# Patient Record
Sex: Male | Born: 1950 | Race: Black or African American | Hispanic: No | Marital: Single
Health system: Midwestern US, Community
[De-identification: ages and names within clinical notes are randomized; demographics above are authoritative.]

---

## 2018-07-18 ENCOUNTER — Inpatient Hospital Stay
Admit: 2018-07-18 | Discharge: 2018-07-19 | Disposition: A | Discharge status: Home / Self Care | Payer: Medicaid - Out of State | Attending: Emergency Medicine

## 2018-07-18 ENCOUNTER — Emergency Department: Admit: 2018-07-19 | Payer: Medicaid - Out of State

## 2018-07-18 DIAGNOSIS — L03115 Cellulitis of right lower limb: Secondary | ICD-10-CM

## 2018-07-18 NOTE — ED Notes (Signed)
Pt presents to ED ambulatory complaining of right leg pain x today. Pt denies injury but states, "I've been walking a lot." Pt is alert and oriented x 4, RR even and unlabored, skin is warm and dry. Assessment completed and pt updated on plan of care.      Emergency Department Nursing Plan of Care       The Nursing Plan of Care is developed from the Nursing assessment and Emergency Department Attending provider initial evaluation.  The plan of care may be reviewed in the ???ED Provider note???.    The Plan of Care was developed with the following considerations:   Patient / Family readiness to learn indicated by:verbalized understanding  Persons(s) to be included in education: patient  Barriers to Learning/Limitations:No    Signed     Alaina Williams V    07/18/2018   7:25 PM

## 2018-07-18 NOTE — ED Notes (Signed)
Duplex technician at pt bedside for procedure. Pt resting contently in room, in no acute distress, and updated on plan of care.

## 2018-07-18 NOTE — ED Notes (Signed)
Pt given sweat shirt, sweat pants, and gloves. Pt resting contently in room, in no acute distress, and updated on plan of care.

## 2018-07-18 NOTE — ED Notes (Signed)
Pt given blanket, bag meal, and waters. Pt resting contently in room watching TV, in no acute distress, and updated on plan of care.   Call light within reach.

## 2018-07-18 NOTE — ED Provider Notes (Signed)
EMERGENCY DEPARTMENT HISTORY AND PHYSICAL EXAM    Date: 07/18/2018  Patient Name: Todd Gomez    History of Presenting Illness     Chief Complaint   Patient presents with   ??? Leg Pain         History Provided By: Patient    Chief Complaint: leg pain  Duration: onset today   Timing:  Acute  Location: right leg pain  Quality: Aching and Burning  Severity: 10 out of 10  Modifying Factors: walking worsens pain  Associated Symptoms: redness of skin swelling      HPI: Kaj Vasil is a 67 y.o. male with a PMH of No significant past medical history who presents with  right right leg pain which she states started today.  Patient states he is homeless and walks a lot.  Patient states his leg is red.  Patient denies injury patient states his leg is swollen.  Patient is poor historian.  He states just give him something for the pain.    PCP: None    Current Outpatient Medications   Medication Sig Dispense Refill   ??? cephALEXin (KEFLEX) 500 mg capsule Take 1 Cap by mouth four (4) times daily for 7 days. 28 Cap 0       Past History     Past Medical History:  History reviewed. No pertinent past medical history.    Past Surgical History:  History reviewed. No pertinent surgical history.    Family History:  History reviewed. No pertinent family history.    Social History:  Social History     Tobacco Use   ??? Smoking status: Current Every Day Smoker   ??? Smokeless tobacco: Never Used   Substance Use Topics   ??? Alcohol use: Yes     Comment: occ   ??? Drug use: Never       Allergies:  No Known Allergies      Review of Systems   Review of Systems   Constitutional: Negative for fever.   HENT: Negative for congestion and sore throat.    Eyes: Negative for redness.   Respiratory: Negative for cough, chest tightness and wheezing.    Cardiovascular: Negative for chest pain.   Gastrointestinal: Negative for abdominal pain.   Musculoskeletal: Negative for arthralgias (leg pain), back pain and neck pain.   Skin: Negative for rash.    Neurological: Negative for headaches.   All other systems reviewed and are negative.      Physical Exam     Vitals:    07/18/18 1920   BP: 149/78   Pulse: 79   Resp: 18   Temp: 97.7 ??F (36.5 ??C)   SpO2: 92%   Weight: 86.2 kg (190 lb)   Height: 6' (1.829 m)     Physical Exam   Constitutional: He is oriented to person, place, and time. He appears well-developed and well-nourished.   Poor personal hygiene   HENT:   Head: Normocephalic and atraumatic.   Right Ear: External ear normal.   Mouth/Throat: Oropharynx is clear and moist.   Eyes: Conjunctivae are normal. Right eye exhibits no discharge. Left eye exhibits no discharge.   Neck: Normal range of motion. Neck supple.   Cardiovascular: Normal rate, regular rhythm and normal heart sounds.   Pulmonary/Chest: Effort normal and breath sounds normal. No respiratory distress. He has no wheezes.   Abdominal: Soft. Bowel sounds are normal. There is no tenderness.   Musculoskeletal: Normal range of motion. He exhibits no edema.  Right lower extremity red skin warm to touch moderate swelling from knee to dorsal aspect of foot no open wounds skin is taut no drainage left lower extremity mild rubor and swelling distal neurovascular status intact patient able to flex ankles and toes   Lymphadenopathy:     He has no cervical adenopathy.   Neurological: He is alert and oriented to person, place, and time. No cranial nerve deficit.   Skin: Skin is warm and dry.   Psychiatric: He has a normal mood and affect. His behavior is normal. Judgment and thought content normal.   Nursing note and vitals reviewed.        Diagnostic Study Results     Labs -   No results found for this or any previous visit (from the past 12 hour(s)).    Radiologic Studies -   No orders to display     CT Results  (Last 48 hours)    None        CXR Results  (Last 48 hours)    None            Medical Decision Making   I am the first provider for this patient.     I reviewed the vital signs, available nursing notes, past medical history, past surgical history, family history and social history.    Vital Signs-Reviewed the patient's vital signs.    Records Reviewed: Nursing Notes and Old Medical Records            Disposition:  home    DISCHARGE NOTE:         Care plan outlined and precautions discussed.  Patient has no new complaints, changes, or physical findings.  Results of tests were reviewed with the patient. All medications were reviewed with the patient; will d/c home with keflex. All of pt's questions and concerns were addressed. Patient was instructed and agrees to follow up with PCP, as well as to return to the ED upon further deterioration. Patient is ready to go home.    Follow-up Information     Follow up With Specialties Details Why Contact Info    [facility]  In 3 days  [address]  [place] [address]          Discharge Medication List as of 07/18/2018  9:41 PM      START taking these medications    Details   cephALEXin (KEFLEX) 500 mg capsule Take 1 Cap by mouth four (4) times daily for 7 days., Print, Disp-28 Cap, R-0             Provider Notes (Medical Decision Making):   DDX T cellulitis venous stasis dermatitis   procedures:  Procedures    Please note that this dictation was completed with Dragon, computer voice recognition software.  Quite often unanticipated grammatical, syntax, homophones, and other interpretive errors are inadvertently transcribed by the computer software.  Please disregard these errors.  Additionally, please excuse any errors that have escaped final proofreading.    Diagnosis     Clinical Impression:   1. Cellulitis of right lower extremity

## 2018-07-18 NOTE — ED Notes (Signed)
Discharge instructions were given to the patient by Alaina Williams V, RN.     The patient left the Emergency Department ambulatory, alert and oriented and in no acute distress with 1 prescription. The patient was encouraged to call or return to the ED for worsening issues or problems and was encouraged to schedule a follow up appointment for continuing care.     The patient verbalized understanding of discharge instructions and prescriptions, all questions were answered. The patient has no further concerns at this time.

## 2018-07-18 NOTE — ED Notes (Signed)
Pt covered in stool, sent to bathroom with bath wipes and gown to clean himself up and change.

## 2018-07-18 NOTE — ED Triage Notes (Signed)
Vascular Tech(Crystal) called in to perform a Venous Duplex of right lower extremity. ETA < 1hr.

## 2018-07-18 NOTE — Progress Notes (Signed)
Right lower extremity venous duplex negative for deep venous thrombosis Hyperechoic thrombus visualized right SSV, no color/doppler detected, appears chronic. Left common femoral vein is thrombus free.    Difficult exam due to patient uncorporating with instructions to valsalva and rotate lower extremity. Enlarged nodule visualized right groin measuring 1.69cm x 3.99cm with a velocity of 17 cm/s. Preliminary verbally given to Angela Nardella, NP.

## 2018-07-18 NOTE — ED Provider Notes (Signed)
ED Provider Notes by Ellsworth Lennox, NP at 07/18/18 2230                Author: Ellsworth Lennox, NP  Service: --  Author Type: Nurse Practitioner       Filed: 07/20/18 0140  Date of Service: 07/18/18 2230  Status: Attested           Editor: Ellsworth Lennox, NP (Nurse Practitioner)  Cosigner: [place]onse[place], Monia Pouch, MD at 07/20/18 1610          Attestation signed by Gwenyth Bender, MD at 07/20/18 [phone]          7:12 AM   I was personally available for consultation in the emergency department.  I have reviewed the chart and agree with the documentation recorded by the APP, including the assessment, treatment plan, and disposition.   Gwenyth Bender, MD                                 EMERGENCY DEPARTMENT HISTORY AND PHYSICAL EXAM      Date: 07/18/2018   Patient Name: Todd Gomez        History of Presenting Illness          Chief Complaint       Patient presents with        ?  Leg Pain              History Provided By: Patient      Chief Complaint: leg pain   Duration: onset today    Timing:  Acute   Location: right leg pain   Quality: Aching and Burning   Severity: 10 out of 10   Modifying Factors: walking worsens pain   Associated Symptoms: redness of skin  swelling         HPI: Alexsis Branscom is a  67 y.o. male with a PMH of No significant  past medical history who presents with  right right leg pain which she states started today.  Patient states he is homeless and walks a lot.  Patient states his leg is red.  Patient denies injury patient  states his leg is swollen.  Patient is poor historian.  He states just give him something for the pain.      PCP: None        Current Outpatient Medications          Medication  Sig  Dispense  Refill           ?  cephALEXin (KEFLEX) 500 mg capsule  Take 1 Cap by mouth four (4) times daily for 7 days.  28 Cap  0             Past History        Past Medical History:   History reviewed. No pertinent past medical history.      Past Surgical History:   History  reviewed. No pertinent surgical history.      Family History:   History reviewed. No pertinent family history.      Social History:     Social History          Tobacco Use         ?  Smoking status:  Current Every Day Smoker     ?  Smokeless tobacco:  Never Used       Substance Use Topics         ?  Alcohol use:  Yes             Comment: occ         ?  Drug use:  Never           Allergies:   No Known Allergies           Review of Systems     Review of Systems    Constitutional: Negative for fever.    HENT: Negative for congestion and sore throat.     Eyes: Negative for redness.    Respiratory: Negative for cough, chest tightness and wheezing.     Cardiovascular: Negative for chest pain.    Gastrointestinal: Negative for abdominal pain.    Musculoskeletal: Negative for arthralgias (leg pain), back pain and neck pain.    Skin: Negative for rash.    Neurological: Negative for headaches.    All other systems reviewed and are negative.           Physical Exam          Vitals:          07/18/18 1920        BP:  149/78     Pulse:  79     Resp:  18     Temp:  97.7 ??F (36.5 ??C)     SpO2:  92%     Weight:  86.2 kg (190 lb)        Height:  6' (1.829 m)        Physical Exam    Constitutional: He is oriented to person, place, and time. He appears well-developed and well-nourished.   Poor personal hygiene    HENT:    Head: Normocephalic and atraumatic.   Right Ear: External ear normal.    Mouth/Throat: Oropharynx is clear and moist.    Eyes: Conjunctivae are normal. Right eye exhibits no discharge. Left eye exhibits no discharge.    Neck: Normal range of motion. Neck supple.    Cardiovascular: Normal rate, regular rhythm and normal heart sounds.    Pulmonary/Chest: Effort normal and breath sounds normal. No respiratory distress. He has no wheezes.    Abdominal: Soft. Bowel sounds are normal. There is no tenderness.   Musculoskeletal: Normal range of motion. He exhibits no edema.   Right lower extremity red skin warm to touch  moderate swelling from  knee to dorsal aspect of foot no open wounds skin is taut no drainage left lower extremity mild rubor and swelling distal neurovascular status intact patient able to flex ankles and toes   Lymphadenopathy:      He has no cervical adenopathy.   Neurological: He is alert and oriented to person, place, and time. No cranial nerve deficit.     Skin: Skin is warm and dry.   Psychiatric: He has a normal mood and affect. His behavior is normal. Judgment and thought content  normal.    Nursing note and vitals reviewed.              Diagnostic Study Results        Labs -    No results found for this or any previous visit (from the past 12 hour(s)).      Radiologic Studies -      No orders to display          CT Results  (Last 48 hours)          None  CXR Results  (Last 48 hours)          None                       Medical Decision Making     I am the first provider for this patient.      I reviewed the vital signs, available nursing notes, past medical history, past surgical history, family history and social history.      Vital Signs-Reviewed the patient's vital signs.      Records Reviewed: Nursing Notes and Old Medical Records                 Disposition:   home      DISCHARGE NOTE:            Care plan outlined and precautions discussed.  Patient has no new complaints, changes, or physical findings.  Results of tests were reviewed with the patient. All medications were reviewed with the patient; will d/c home with keflex. All of pt's questions  and concerns were addressed. Patient was instructed and agrees to follow up with PCP, as well as to return to the ED upon further deterioration. Patient is ready to go home.        Follow-up Information               Follow up With  Specialties  Details  Why  Contact Info              [facility]    In 3 days    [address]   [place] [address]                  Discharge Medication List as of 07/18/2018  9:41 PM              START  taking these medications          Details        cephALEXin (KEFLEX) 500 mg capsule  Take 1 Cap by mouth four (4) times daily for 7 days., Print, Disp-28 Cap, R-0                         Provider Notes (Medical Decision Making):    DDX T cellulitis venous stasis dermatitis    procedures:   Procedures      Please note that this dictation was completed with Dragon, computer voice recognition software.  Quite often unanticipated grammatical, syntax, homophones, and other interpretive errors are inadvertently transcribed by the computer software.  Please disregard  these errors.  Additionally, please excuse any errors that have escaped final proofreading.        Diagnosis        Clinical Impression:       1.  Cellulitis of right lower extremity

## 2018-07-18 NOTE — ED Notes (Signed)
Duplex technician at pt bedside for procedure. Pt resting contently in room, in no acute distress, and updated on plan of care.

## 2018-07-18 NOTE — ED Notes (Signed)
Pt given sweat shirt, sweat pants, and gloves. Pt resting contently in room, in no acute distress, and updated on plan of care.

## 2018-07-18 NOTE — Progress Notes (Signed)
Right lower extremity venous duplex negative for deep venous thrombosis Hyperechoic thrombus visualized right SSV, no color/doppler detected, appears chronic. Left common femoral vein is thrombus free.    Difficult exam due to patient uncorporating with instructions to valsalva and rotate lower extremity. Enlarged nodule visualized right groin measuring 1.69cm x 3.99cm with a velocity of 17 cm/s. Preliminary verbally given to Clayborn Bigness, NP.

## 2018-07-18 NOTE — ED Notes (Signed)
 Pt presents to ED ambulatory complaining of right leg pain x today. Pt denies injury but states, I've been walking a lot. Pt is alert and oriented x 4, RR even and unlabored, skin is warm and dry. Assessment completed and pt updated on plan of care.      Emergency Department Nursing Plan of Care       The Nursing Plan of Care is developed from the Nursing assessment and Emergency Department Attending provider initial evaluation.  The plan of care may be reviewed in the "ED Provider note".    The Plan of Care was developed with the following considerations:   Patient / Family readiness to learn indicated ab:czmajopszi understanding  Persons(s) to be included in education: patient  Barriers to Learning/Limitations:No    Signed     Luster Trudy GAILS    07/18/2018   7:25 PM

## 2018-07-18 NOTE — ED Notes (Signed)
Pt given blanket, bag meal, and waters. Pt resting contently in room watching TV, in no acute distress, and updated on plan of care.   Call light within reach.

## 2018-07-18 NOTE — ED Notes (Signed)
Vascular Tech(Crystal) called in to perform a Venous Duplex of right lower extremity. ETA < 1hr.

## 2018-07-19 LAB — CBC WITH AUTOMATED DIFF
ABS. BASOPHILS: 0 10*3/uL (ref 0.0–0.1)
ABS. EOSINOPHILS: 0.1 10*3/uL (ref 0.0–0.4)
ABS. IMM. GRANS.: 0 10*3/uL (ref 0.00–0.04)
ABS. LYMPHOCYTES: 1.2 10*3/uL (ref 0.8–3.5)
ABS. MONOCYTES: 0.5 10*3/uL (ref 0.0–1.0)
ABS. NEUTROPHILS: 3.6 10*3/uL (ref 1.8–8.0)
ABSOLUTE NRBC: 0 10*3/uL (ref 0.00–0.01)
BASOPHILS: 0 % (ref 0–1)
EOSINOPHILS: 2 % (ref 0–7)
HCT: 36.2 % — ABNORMAL LOW (ref 36.6–50.3)
HGB: 11.9 g/dL — ABNORMAL LOW (ref 12.1–17.0)
IMMATURE GRANULOCYTES: 0 % (ref 0.0–0.5)
LYMPHOCYTES: 23 % (ref 12–49)
MCH: 28.7 PG (ref 26.0–34.0)
MCHC: 32.9 g/dL (ref 30.0–36.5)
MCV: 87.2 FL (ref 80.0–99.0)
MONOCYTES: 9 % (ref 5–13)
MPV: 9 FL (ref 8.9–12.9)
NEUTROPHILS: 66 % (ref 32–75)
NRBC: 0 PER 100 WBC
PLATELET: 377 10*3/uL (ref 150–400)
RBC: 4.15 M/uL (ref 4.10–5.70)
RDW: 14.9 % — ABNORMAL HIGH (ref 11.5–14.5)
WBC: 5.4 10*3/uL (ref 4.1–11.1)

## 2018-07-19 LAB — METABOLIC PANEL, COMPREHENSIVE
A-G Ratio: 0.8 — ABNORMAL LOW (ref 1.1–2.2)
ALT (SGPT): 49 U/L (ref 12–78)
AST (SGOT): 46 U/L — ABNORMAL HIGH (ref 15–37)
Albumin: 3.5 g/dL (ref 3.5–5.0)
Alk. phosphatase: 82 U/L (ref 45–117)
Anion gap: 6 mmol/L (ref 5–15)
BUN/Creatinine ratio: 10 — ABNORMAL LOW (ref 12–20)
BUN: 9 MG/DL (ref 6–20)
Bilirubin, total: 0.4 MG/DL (ref 0.2–1.0)
CO2: 31 mmol/L (ref 21–32)
Calcium: 8.9 MG/DL (ref 8.5–10.1)
Chloride: 105 mmol/L (ref 97–108)
Creatinine: 0.87 MG/DL (ref 0.70–1.30)
GFR est AA: >60 mL/min/{1.73_m2} (ref >60)
GFR est non-AA: >60 mL/min/{1.73_m2} (ref >60)
Globulin: 4.3 g/dL — ABNORMAL HIGH (ref 2.0–4.0)
Glucose: 85 mg/dL (ref 65–100)
Potassium: 4.2 mmol/L (ref 3.5–5.1)
Protein, total: 7.8 g/dL (ref 6.4–8.2)
Sodium: 142 mmol/L (ref 136–145)

## 2018-07-19 LAB — COMPREHENSIVE METABOLIC PANEL
ALT: 49 U/L (ref 12–78)
AST: 46 U/L — ABNORMAL HIGH (ref 15–37)
Albumin/Globulin Ratio: 0.8 — ABNORMAL LOW (ref 1.1–2.2)
Albumin: 3.5 g/dL (ref 3.5–5.0)
Alkaline Phosphatase: 82 U/L (ref 45–117)
Anion Gap: 6 mmol/L (ref 5–15)
BUN: 9 MG/DL (ref 6–20)
Bun/Cre Ratio: 10 — ABNORMAL LOW (ref 12–20)
CO2: 31 mmol/L (ref 21–32)
Calcium: 8.9 MG/DL (ref 8.5–10.1)
Chloride: 105 mmol/L (ref 97–108)
Creatinine: 0.87 MG/DL (ref 0.70–1.30)
EGFR IF NonAfrican American: >60 mL/min/{1.73_m2} (ref >60)
GFR African American: >60 mL/min/{1.73_m2} (ref >60)
Globulin: 4.3 g/dL — ABNORMAL HIGH (ref 2.0–4.0)
Glucose: 85 mg/dL (ref 65–100)
Potassium: 4.2 mmol/L (ref 3.5–5.1)
Sodium: 142 mmol/L (ref 136–145)
Total Bilirubin: 0.4 MG/DL (ref 0.2–1.0)
Total Protein: 7.8 g/dL (ref 6.4–8.2)

## 2018-07-19 LAB — CBC WITH AUTO DIFFERENTIAL
Basophils %: 0 % (ref 0–1)
Basophils Absolute: 0 10*3/uL (ref 0.0–0.1)
Eosinophils %: 2 % (ref 0–7)
Eosinophils Absolute: 0.1 10*3/uL (ref 0.0–0.4)
Granulocyte Absolute Count: 0 10*3/uL (ref 0.00–0.04)
Hematocrit: 36.2 % — ABNORMAL LOW (ref 36.6–50.3)
Hemoglobin: 11.9 g/dL — ABNORMAL LOW (ref 12.1–17.0)
Immature Granulocytes: 0 % (ref 0.0–0.5)
Lymphocytes %: 23 % (ref 12–49)
Lymphocytes Absolute: 1.2 10*3/uL (ref 0.8–3.5)
MCH: 28.7 PG (ref 26.0–34.0)
MCHC: 32.9 g/dL (ref 30.0–36.5)
MCV: 87.2 FL (ref 80.0–99.0)
MPV: 9 FL (ref 8.9–12.9)
Monocytes %: 9 % (ref 5–13)
Monocytes Absolute: 0.5 10*3/uL (ref 0.0–1.0)
NRBC Absolute: 0 10*3/uL (ref 0.00–0.01)
Neutrophils %: 66 % (ref 32–75)
Neutrophils Absolute: 3.6 10*3/uL (ref 1.8–8.0)
Nucleated RBCs: 0 PER 100 WBC
Platelets: 377 10*3/uL (ref 150–400)
RBC: 4.15 M/uL (ref 4.10–5.70)
RDW: 14.9 % — ABNORMAL HIGH (ref 11.5–14.5)
WBC: 5.4 10*3/uL (ref 4.1–11.1)

## 2018-07-19 MED ORDER — LIDOCAINE (PF) 10 MG/ML (1 %) IJ SOLN
10 mg/mL (1 %) | INTRAMUSCULAR | Status: AC
Start: 2018-07-19 — End: 2018-07-18
  Administered 2018-07-19: 03:00:00 via INTRAMUSCULAR

## 2018-07-19 MED ORDER — CEPHALEXIN 500 MG CAP
500 mg | ORAL_CAPSULE | Freq: Four times a day (QID) | ORAL | 0 refills | Status: AC
Start: 2018-07-19 — End: 2018-07-25

## 2018-07-19 MED FILL — CEFTRIAXONE 1 GRAM SOLUTION FOR INJECTION: 1 gram | INTRAMUSCULAR | Qty: 1

## 2019-05-18 ENCOUNTER — Other Ambulatory Visit: Payer: Self-pay

## 2019-05-18 ENCOUNTER — Emergency Department (HOSPITAL_COMMUNITY)
Admission: EM | Admit: 2019-05-18 | Discharge: 2019-05-18 | Disposition: A | Discharge status: Home / Self Care | Payer: Self-pay | Attending: Emergency Medicine | Admitting: Emergency Medicine

## 2019-05-18 DIAGNOSIS — R2243 Localized swelling, mass and lump, lower limb, bilateral: Secondary | ICD-10-CM | POA: Insufficient documentation

## 2019-05-18 DIAGNOSIS — L03115 Cellulitis of right lower limb: Secondary | ICD-10-CM | POA: Insufficient documentation

## 2019-05-18 DIAGNOSIS — M79672 Pain in left foot: Secondary | ICD-10-CM | POA: Insufficient documentation

## 2019-05-18 MED ORDER — DOXYCYCLINE HYCLATE 100 MG PO CAPS: 100.0000 mg | ORAL_CAPSULE | Freq: Two times a day (BID) | ORAL | 0 refills | Status: DC

## 2019-05-18 MED ORDER — DOXYCYCLINE HYCLATE 100 MG PO TABS
100.0000 mg | ORAL_TABLET | Freq: Two times a day (BID) | ORAL | Status: DC
  Filled 2019-05-18: qty 6

## 2019-05-18 MED ORDER — DOXYCYCLINE HYCLATE 100 MG PO TABS
100.0000 mg | ORAL_TABLET | Freq: Once | ORAL | Status: AC
  Administered 2019-05-18: 100 mg via ORAL
  Filled 2019-05-18: qty 1

## 2019-05-18 MED ORDER — ACETAMINOPHEN 325 MG PO TABS
650.0000 mg | ORAL_TABLET | Freq: Once | ORAL | Status: AC
  Administered 2019-05-18: 19:00:00 650 mg via ORAL
  Filled 2019-05-18: qty 2

## 2019-05-18 MED ORDER — DOXYCYCLINE HYCLATE 100 MG PO CAPS: 100.0000 mg | ORAL_CAPSULE | Freq: Two times a day (BID) | ORAL | 0 refills | Status: AC

## 2019-05-18 NOTE — ED Notes (Signed)
Pt given turkey sandwich and coke 

## 2019-05-18 NOTE — ED Notes (Signed)
Gave pt "ER Happy Meal", graham crackers and Sprite, per William Hamburger - RN.

## 2019-05-18 NOTE — ED Provider Notes (Signed)
[facility] EMERGENCY DEPARTMENT Provider Note   CSN: [id number] Arrival date & time: 05/18/19  1332     History   Chief Complaint Chief Complaint  Patient presents with  . Foot Pain    HPI Russell Johnston is a 68 y.o. male who presents to the emergency department with bilateral foot pain. Patient reports he is homeless and just arrived from [place]. Patient reports he was recently in the hospital in [place] for pain and swelling in his bilateral lower extremities. Patient reports he lost his paperwork at the shelter in [place] so he was not able to pick up his antibiotics. Patient reports he does not have any shoes and was walking around in his socks and started to have more pain and swelling in his feet prompting his presentation in the emergency department. Patient denies any trauma to his legs or feet and denies fevers. Patient denies history of blood clots or osteomyelitis.      The history is provided by the patient.      History reviewed. No pertinent past medical history.  There are no active problems to display for this patient.   History reviewed. No pertinent surgical history.      Home Medications    Prior to Admission medications   Medication Sig Start Date End Date Taking? Authorizing Provider  doxycycline (VIBRAMYCIN) 100 MG capsule Take 1 capsule (100 mg total) by mouth 2 (two) times daily for 4 days. 05/18/19 05/22/19  Betsey Amen, MD  doxycycline (VIBRAMYCIN) 100 MG capsule Take 1 capsule (100 mg total) by mouth 2 (two) times daily for 3 days. 05/18/19 05/21/19  Betsey Amen, MD    Family History No family history on file.  Social History Social History   Tobacco Use  . Smoking status: Not on file  Substance Use Topics  . Alcohol use: Not on file  . Drug use: Not on file     Allergies   Patient has no known allergies.   Review of Systems Review of Systems  Constitutional: Negative for fever.  HENT: Negative for  congestion and trouble swallowing.   Eyes: Negative for visual disturbance.  Respiratory: Negative for cough and shortness of breath.   Cardiovascular: Positive for leg swelling.  Genitourinary: Negative for dysuria.  Musculoskeletal: Positive for gait problem.  Skin: Positive for wound.  Neurological: Negative for seizures, syncope, weakness, numbness and headaches.  Psychiatric/Behavioral: Negative for confusion.     Physical Exam Updated Vital Signs BP 125/71   Pulse 79   Temp 98.9 F (37.2 C) (Oral)   Resp 12   SpO2 99%   Physical Exam Constitutional:      General: He is not in acute distress. HENT:     Head: Normocephalic and atraumatic.     Right Ear: External ear normal.     Left Ear: External ear normal.     Nose: Nose normal.     Mouth/Throat:     Mouth: Mucous membranes are moist.     Pharynx: Oropharynx is clear.  Eyes:     Conjunctiva/sclera: Conjunctivae normal.  Neck:     Musculoskeletal: Neck supple.  Cardiovascular:     Rate and Rhythm: Normal rate and regular rhythm.     Pulses: Normal pulses.  Pulmonary:     Effort: Pulmonary effort is normal. No respiratory distress.     Breath sounds: No wheezing, rhonchi or rales.  Chest:     Chest wall: No tenderness.  Abdominal:  Palpations: Abdomen is soft.     Tenderness: There is no abdominal tenderness. There is no guarding.  Musculoskeletal:     Right lower leg: Edema present.     Left lower leg: Edema present.     Comments: DP pulses dopplerable in bilateral lower extremities  Feet:     Right foot:     Skin integrity: Skin breakdown (superficial over dorsum of foot and medial malleolus ) present.     Left foot:     Skin integrity: Skin breakdown (superficial medial and lateral malleolus) present.  Skin:    General: Skin is warm and dry.     Comments: Chronic skin changes in bilateral lower extremities. Warmth of R lower leg with some overlying erythema  Neurological:     General: No focal  deficit present.     Mental Status: He is alert and oriented to person, place, and time.     Cranial Nerves: No cranial nerve deficit.     Motor: No weakness.     Coordination: Coordination normal.      ED Treatments / Results  Labs (all labs ordered are listed, but only abnormal results are displayed) Labs Reviewed - No data to display  EKG None  Radiology No results found.  Procedures Procedures (including critical care time)  Medications Ordered in ED Medications  acetaminophen (TYLENOL) tablet 650 mg (650 mg Oral Given 05/18/19 1929)  doxycycline (VIBRA-TABS) tablet 100 mg (100 mg Oral Given 05/18/19 1929)     Initial Impression / Assessment and Plan / ED Course  I have reviewed the triage vital signs and the nursing notes.  Pertinent labs & imaging results that were available during my care of the patient were reviewed by me and considered in my medical decision making (see chart for details).       Patient's bilateral lower extremities are neurovascularly intact. Physical exam consistent with cellulitis of right lower extremity. Mild skin breakdown on bilateral feet very superficial and appears to be healing well, very low suspicion for osteomyelitis at this time. Unable to see OSH records however patient's history consistent with cellulitis, has not been having fevers. Patient's feet swelling and pain likely exacerbated by not having shoes. Patient given tylenol for pain and a dose of doxycycline. Social work and case management met with patient and provided with resources, follow up, and prescription for course of doxycycline to treat cellulitis. Patient given 2 post-op shoes for shoes. All questions answered and strict return precautions given. Patient comfortable with plan to take antibiotics and to follow up with provided resources.  Patient seen and plan discussed with Dr. Billy Fischer.  Final Clinical Impressions(s) / ED Diagnoses   Final diagnoses:  Cellulitis of  right lower extremity    ED Discharge Orders         Ordered    doxycycline (VIBRAMYCIN) 100 MG capsule  2 times daily     05/18/19 1955    doxycycline (VIBRAMYCIN) 100 MG capsule  2 times daily     05/18/19 1955           Warrene Kapfer, Missy Sabins, MD 05/19/19 0177    Gareth Morgan, MD 05/20/19 2052

## 2019-05-18 NOTE — Progress Notes (Signed)
CSW at bedside to offer resources for homelessness. CSW provided pt clothing items from social work closet along with information about the [facility] and surrounding shelters. Pt expressed gratitude and content with being offered the information, clothing and taxi voucher.   [facility]  Clinical Social Worker  Ph: [phone]

## 2019-05-18 NOTE — ED Triage Notes (Signed)
[place] [facility] [place] pt just arrived to [place] on train from [place] was just in hospital 5 days ago and released in [place] but is homeless and was unable to get medication filled. Pt was wound son feet that have a bandage placed on 9/1 the day he left the hospital.

## 2019-05-18 NOTE — Care Management (Signed)
ED CM consulted concerning medication assistance and homelessness.  CM met with patient who reports recently being released from hospital in [place], patient also states he is homeless.  Patient is not from the area.  CM spoke with  Inpatient pharmacy to assist with a couple of days supply until [facility] and [facility] Medical Provider there. Patient will be provided with a 3 day supply of antibiotic upon discharge from the ED. CM enrolled patient in  [facility] medication assistance program, and given a letter to take to pharmacy to assist with payment for prescriptions.  Patient verbalizes understanding with transitional plan. Updated EDP and Kayla RN on Nathalie. CSW consulted as well for homeless resources.

## 2019-05-18 NOTE — ED Notes (Signed)
Discharge instructions and prescriptions discussed with Pt. Pt given 3 day supply of doxycycline from pharmacy and aware he will need to fill prescription for last 4 days. Pt verbalized understanding. Pt stable and ambulatory with rolling walker. Leaving via [employer] set up by Education officer, museum.

## 2019-05-18 NOTE — Progress Notes (Signed)
Consult request has been received. CSW attempting to follow up at present time  Brooklinn Longbottom M. Brinden Kincheloe LCSWA Transitions of Care  Clinical Social Worker  Ph: [phone] 

## 2019-05-19 ENCOUNTER — Encounter (HOSPITAL_COMMUNITY): Payer: Self-pay | Admitting: Emergency Medicine

## 2019-05-19 ENCOUNTER — Emergency Department (HOSPITAL_COMMUNITY)
Admission: EM | Admit: 2019-05-19 | Discharge: 2019-05-19 | Disposition: A | Discharge status: Home / Self Care | Payer: Self-pay | Attending: Emergency Medicine | Admitting: Emergency Medicine

## 2019-05-19 ENCOUNTER — Other Ambulatory Visit: Payer: Self-pay

## 2019-05-19 DIAGNOSIS — F17210 Nicotine dependence, cigarettes, uncomplicated: Secondary | ICD-10-CM | POA: Insufficient documentation

## 2019-05-19 DIAGNOSIS — Z5900 Homelessness unspecified: Secondary | ICD-10-CM

## 2019-05-19 DIAGNOSIS — M79671 Pain in right foot: Secondary | ICD-10-CM | POA: Insufficient documentation

## 2019-05-19 DIAGNOSIS — M79672 Pain in left foot: Secondary | ICD-10-CM | POA: Insufficient documentation

## 2019-05-19 MED ORDER — ACETAMINOPHEN 500 MG PO TABS
1000.0000 mg | ORAL_TABLET | Freq: Once | ORAL | Status: AC
  Administered 2019-05-19: 1000 mg via ORAL
  Filled 2019-05-19: qty 2

## 2019-05-19 NOTE — ED Notes (Signed)
Pt given meal

## 2019-05-19 NOTE — ED Triage Notes (Signed)
Pt presents via [facility] for evaluation of foot pain. Pt was seen at [facility] on 05/18/2019 for same pt was discharged form [facility] and unable to get medications. Pt is currently homeless.

## 2019-05-19 NOTE — ED Provider Notes (Signed)
[facility] DEPT Provider Note   CSN: [id number] Arrival date & time: 05/19/19  0320     History   Chief Complaint Chief Complaint  Patient presents with  . Foot Pain    HPI Russell Johnston is a 68 y.o. male.     The history is provided by the patient.  Foot Pain This is a chronic problem. The current episode started more than 1 week ago. The problem occurs constantly. The problem has not changed since onset.Pertinent negatives include no chest pain, no abdominal pain, no headaches and no shortness of breath. Nothing aggravates the symptoms. Nothing relieves the symptoms. He has tried nothing for the symptoms. The treatment provided no relief.  Patient is homeless and does not have shoes and has foot pain from walking around.  No wounds no trauma.  Also is hungry and would like something to eat.  No other complaints at this time.    History reviewed. No pertinent past medical history.  There are no active problems to display for this patient.   History reviewed. No pertinent surgical history.      Home Medications    Prior to Admission medications   Medication Sig Start Date End Date Taking? Authorizing Provider  doxycycline (VIBRAMYCIN) 100 MG capsule Take 1 capsule (100 mg total) by mouth 2 (two) times daily for 4 days. 05/18/19 05/22/19  Betsey Amen, MD  doxycycline (VIBRAMYCIN) 100 MG capsule Take 1 capsule (100 mg total) by mouth 2 (two) times daily for 3 days. 05/18/19 05/21/19  Betsey Amen, MD    Family History No family history on file.  Social History Social History   Tobacco Use  . Smoking status: Current Some Day Smoker    Types: Cigarettes  . Smokeless tobacco: Never Used  Substance Use Topics  . Alcohol use: Yes  . Drug use: Never     Allergies   Patient has no known allergies.   Review of Systems Review of Systems  Constitutional: Negative for fever.  HENT: Negative for congestion.   Eyes: Negative  for visual disturbance.  Respiratory: Negative for cough and shortness of breath.   Cardiovascular: Negative for chest pain.  Gastrointestinal: Negative for abdominal pain.  Genitourinary: Negative for decreased urine volume.  Musculoskeletal: Positive for arthralgias.  Neurological: Negative for headaches.  Psychiatric/Behavioral: Negative for agitation.  All other systems reviewed and are negative.    Physical Exam Updated Vital Signs BP 128/78 (BP Location: Right Arm)   Pulse 77   Temp 97.7 F (36.5 C) (Oral)   Resp 18   Ht 6\' 4"  (1.93 m)   Wt 110.2 kg   SpO2 98%   BMI 29.58 kg/m   Physical Exam Vitals signs and nursing note reviewed.  Constitutional:      General: He is not in acute distress.    Appearance: He is normal weight.  HENT:     Head: Normocephalic and atraumatic.     Nose: Nose normal.  Eyes:     Conjunctiva/sclera: Conjunctivae normal.     Pupils: Pupils are equal, round, and reactive to light.  Neck:     Musculoskeletal: Normal range of motion and neck supple.  Cardiovascular:     Rate and Rhythm: Normal rate and regular rhythm.     Pulses: Normal pulses.     Heart sounds: Normal heart sounds.  Pulmonary:     Effort: Pulmonary effort is normal.     Breath sounds: Normal breath sounds.  Abdominal:  General: Abdomen is flat. Bowel sounds are normal.     Tenderness: There is no abdominal tenderness.  Musculoskeletal: Normal range of motion.  Skin:    General: Skin is warm and dry.     Capillary Refill: Capillary refill takes less than 2 seconds.  Neurological:     General: No focal deficit present.     Mental Status: He is alert and oriented to person, place, and time.  Psychiatric:        Mood and Affect: Mood normal.        Behavior: Behavior normal.      ED Treatments / Results  Labs (all labs ordered are listed, but only abnormal results are displayed) Labs Reviewed - No data to display  EKG None  Radiology No results found.   Procedures Procedures (including critical care time)  Medications Ordered in ED Medications  acetaminophen (TYLENOL) tablet 1,000 mg (1,000 mg Oral Given 05/19/19 0458)     Given food and drink.  Given hospital socks.  We do not have shoes that will fit him.  He has post op shoes.    Final Clinical Impressions(s) / ED Diagnoses   Final diagnoses:  Foot pain, right  Foot pain, left  Homelessness    Return for intractable cough, coughing up blood,fevers >100.4 unrelieved by medication, shortness of breath, intractable vomiting, chest pain, shortness of breath, weakness,numbness, changes in speech, facial asymmetry,abdominal pain, passing out,Inability to tolerate liquids or food, cough, altered mental status or any concerns. No signs of systemic illness or infection. The patient is nontoxic-appearing on exam and vital signs are within normal limits.   I have reviewed the triage vital signs and the nursing notes. Pertinent labs &imaging results that were available during my care of the patient were reviewed by me and considered in my medical decision making (see chart for details). After history, exam, and medical workup I feel the patient has beenappropriately medically screened and is safe for discharge home. Pertinent diagnoses were discussed with the patient. Patient was givenreturn precautions   Nevin Grizzle, MD 05/19/19 [phone]

## 2019-05-22 ENCOUNTER — Encounter (HOSPITAL_COMMUNITY): Payer: Self-pay | Admitting: Emergency Medicine

## 2019-05-22 ENCOUNTER — Emergency Department (HOSPITAL_COMMUNITY)
Admission: EM | Admit: 2019-05-22 | Discharge: 2019-05-23 | Disposition: A | Discharge status: Home / Self Care | Payer: Self-pay | Attending: Emergency Medicine | Admitting: Emergency Medicine

## 2019-05-22 ENCOUNTER — Other Ambulatory Visit: Payer: Self-pay

## 2019-05-22 DIAGNOSIS — M79671 Pain in right foot: Secondary | ICD-10-CM | POA: Insufficient documentation

## 2019-05-22 DIAGNOSIS — M79672 Pain in left foot: Secondary | ICD-10-CM | POA: Insufficient documentation

## 2019-05-22 DIAGNOSIS — Z5900 Homelessness unspecified: Secondary | ICD-10-CM

## 2019-05-22 DIAGNOSIS — F17210 Nicotine dependence, cigarettes, uncomplicated: Secondary | ICD-10-CM | POA: Insufficient documentation

## 2019-05-22 DIAGNOSIS — R197 Diarrhea, unspecified: Secondary | ICD-10-CM | POA: Insufficient documentation

## 2019-05-22 LAB — COMPREHENSIVE METABOLIC PANEL
ALT: 39 U/L (ref 0–44)
AST: 37 U/L (ref 15–41)
Albumin: 3.1 g/dL — ABNORMAL LOW (ref 3.5–5.0)
Alkaline Phosphatase: 52 U/L (ref 38–126)
Anion gap: 9 (ref 5–15)
BUN: 11 mg/dL (ref 8–23)
CO2: 23 mmol/L (ref 22–32)
Calcium: 8.6 mg/dL — ABNORMAL LOW (ref 8.9–10.3)
Chloride: 107 mmol/L (ref 98–111)
Creatinine, Ser: 1.15 mg/dL (ref 0.61–1.24)
GFR calc Af Amer: >60 mL/min (ref >60)
GFR calc non Af Amer: >60 mL/min (ref >60)
Glucose, Bld: 108 mg/dL — ABNORMAL HIGH (ref 70–99)
Potassium: 3.5 mmol/L (ref 3.5–5.1)
Sodium: 139 mmol/L (ref 135–145)
Total Bilirubin: 0.5 mg/dL (ref 0.3–1.2)
Total Protein: 6.3 g/dL — ABNORMAL LOW (ref 6.5–8.1)

## 2019-05-22 LAB — CBC
HCT: 32.4 % — ABNORMAL LOW (ref 39.0–52.0)
Hemoglobin: 10.9 g/dL — ABNORMAL LOW (ref 13.0–17.0)
MCH: 28.7 pg (ref 26.0–34.0)
MCHC: 33.6 g/dL (ref 30.0–36.0)
MCV: 85.3 fL (ref 80.0–100.0)
Platelets: 284 10*3/uL (ref 150–400)
RBC: 3.8 MIL/uL — ABNORMAL LOW (ref 4.22–5.81)
RDW: 15.3 % (ref 11.5–15.5)
WBC: 3.6 10*3/uL — ABNORMAL LOW (ref 4.0–10.5)
nRBC: 0 % (ref 0.0–0.2)

## 2019-05-22 LAB — LIPASE, BLOOD: Lipase: 39 U/L (ref 11–51)

## 2019-05-22 LAB — URINALYSIS, ROUTINE W REFLEX MICROSCOPIC
Bilirubin Urine: NEGATIVE
Glucose, UA: NEGATIVE mg/dL
Hgb urine dipstick: NEGATIVE
Ketones, ur: NEGATIVE mg/dL
Leukocytes,Ua: NEGATIVE
Nitrite: NEGATIVE
Protein, ur: NEGATIVE mg/dL
Specific Gravity, Urine: 1.014 (ref 1.005–1.030)
pH: 5 (ref 5.0–8.0)

## 2019-05-22 MED ORDER — SODIUM CHLORIDE 0.9% FLUSH: 3.0000 mL | Freq: Once | INTRAVENOUS | Status: DC

## 2019-05-22 MED ORDER — LOPERAMIDE HCL 2 MG PO CAPS
2.0000 mg | ORAL_CAPSULE | Freq: Once | ORAL | Status: AC
  Administered 2019-05-23: 2 mg via ORAL
  Filled 2019-05-22: qty 1

## 2019-05-22 NOTE — Progress Notes (Addendum)
CSW spoke to EDP's and pt had not been assigned to EDP yet.  CSW standing by for progress regarding pt's medical work-up.  CSW met with pt and pt was unable to easily articulate answers to CSW's questions.  Pt presented as if he was not fully A&O, possibly intoxicated, possibly greatly fatigued.   Pt at first stated that he arrived to [place] via taxi until [place] stated CSW had read pt had arrived by train, which the pt confirmed after much difficulty.  Much of what pt said was unable to be understood and pt presented as extremely fatigued and fell asleep while attempting to talk X 2.  10:59 PM As of this writing no provider has signed up for the pt, per the ED Secretary.  2nd shift ED CSW will leave handoff for 1st shift ED CSW.  CSW will continue to follow for D/C needs.  Alphonse Guild. Lukas Pelcher, LCSW, LCAS, CSI Transitions of Care Clinical Social Worker Care Coordination Department Ph: [phone]

## 2019-05-22 NOTE — Progress Notes (Addendum)
Consult request has been received. CSW attempting to follow up at present time.  CSW reviewed chart and sees pt was at [facility] ED and received 3 days worth of meds (antibiotics) 3 days ago on 9/7 via the [facility] program letter which provides a one-time free med refill which cannot be repeated within the same year.  Per the notes, pt had or was provided a rolling walker or had arrived and then discharged with a rolling walker and clothes from the [facility] ED clothing closet.  When asked the pt stated he did not obtain the free prescription of antibiotics from the pharmacy, using the [facility] letter provided to him by the [facility] ED RN CM.  Per chart review pt now arrives again with no shoes and bilateral foot pain.  CSW when considering all options for assistance with the pt an depending a medical work up which would further provide clarification as to pt's needs.  It is not known at this time  Marylou Flesher, [place], [place] Social Worker [phone]

## 2019-05-22 NOTE — ED Notes (Signed)
Pt ambulated with a walker to the restroom to provide a urine specimen. Urine specimen not collected. Urine and feces found all over the restroom.

## 2019-05-22 NOTE — ED Triage Notes (Signed)
Pt brought in by [facility] for epigastric  abd pain and bilat foot pain see at this ED for same in  Recent past 2 - 3 days. Pt is homeless and noncomplaint with diet and medication and currently incontinent bowel.

## 2019-05-22 NOTE — ED Provider Notes (Signed)
[facility] DEPT Provider Note   CSN: [id number] Arrival date & time: 05/22/19  [place]     History   Chief Complaint Chief Complaint  Patient presents with  . Abdominal Pain  . Foot Pain    HPI Russell Johnston is a 68 y.o. male.     68 year old male presents to the emergency department for multiple complaints.  He primarily complains of his bilateral feet.  States that they have been hurting from walking.  He was seen for this on 05/18/2019 and 05/19/2019.  Was diagnosed with cellulitis and discharged on doxycycline, but is homeless and was unable to obtain this medication.  Feels that the swelling in his feet and legs have actually improved since he was last seen.  Denies any known fevers.  Patient also complaining of generalized abdominal pain.  He describes a cramping discomfort as well as persistent diarrhea.  Endorsing chronic diarrhea at baseline and feels this is not specifically changed today.  He has not taken any medications for the symptoms.  Denies any drug use, but does drink alcohol.  The history is provided by the patient. No language interpreter was used.  Abdominal Pain Foot Pain Associated symptoms include abdominal pain.    History reviewed. No pertinent past medical history.  There are no active problems to display for this patient.   History reviewed. No pertinent surgical history.      Home Medications    Prior to Admission medications   Not on File    Family History History reviewed. No pertinent family history.  Social History Social History   Tobacco Use  . Smoking status: Current Some Day Smoker    Types: Cigarettes  . Smokeless tobacco: Never Used  Substance Use Topics  . Alcohol use: Yes  . Drug use: Never     Allergies   Patient has no known allergies.   Review of Systems Review of Systems  Gastrointestinal: Positive for abdominal pain.  Ten systems reviewed and are negative for acute change,  except as noted in the HPI.     Physical Exam Updated Vital Signs BP (!) 141/91 (BP Location: Left Arm)   Pulse 62   Temp 98.5 F (36.9 C) (Oral)   Resp 17   SpO2 97%   Physical Exam Vitals signs and nursing note reviewed.  Constitutional:      General: He is not in acute distress.    Appearance: He is well-developed. He is not diaphoretic.     Comments: Disheveled. Foul smelling.  HENT:     Head: Normocephalic and atraumatic.  Eyes:     General: No scleral icterus.    Conjunctiva/sclera: Conjunctivae normal.  Neck:     Musculoskeletal: Normal range of motion.  Cardiovascular:     Rate and Rhythm: Normal rate and regular rhythm.     Pulses: Normal pulses.     Comments: DP pulse 2+ BLE Pulmonary:     Effort: Pulmonary effort is normal. No respiratory distress.     Comments: Respirations even and unlabored Abdominal:     Palpations: Abdomen is soft.     Tenderness: There is no abdominal tenderness.     Comments: Soft, nontender, distended abdomen.  Musculoskeletal: Normal range of motion.     Comments: 1+ pitting edema to BLE with some mild desquamation to bilateral feet. Mild erythema without significant heat to touch. No lymphangitic streaking.  Skin:    General: Skin is warm and dry.     Coloration: Skin  is not pale.     Findings: No erythema or rash.  Neurological:     Mental Status: He is alert and oriented to person, place, and time.  Psychiatric:        Behavior: Behavior normal.      ED Treatments / Results  Labs (all labs ordered are listed, but only abnormal results are displayed) Labs Reviewed  COMPREHENSIVE METABOLIC PANEL - Abnormal; Notable for the following components:      Result Value   Glucose, Bld 108 (*)    Calcium 8.6 (*)    Total Protein 6.3 (*)    Albumin 3.1 (*)    All other components within normal limits  CBC - Abnormal; Notable for the following components:   WBC 3.6 (*)    RBC 3.80 (*)    Hemoglobin 10.9 (*)    HCT 32.4 (*)     All other components within normal limits  URINALYSIS, ROUTINE W REFLEX MICROSCOPIC - Abnormal; Notable for the following components:   APPearance HAZY (*)    All other components within normal limits  LIPASE, BLOOD    EKG EKG Interpretation  Date/Time:  Friday May 22 2019 20:54:17 EDT Ventricular Rate:  56 PR Interval:  168 QRS Duration: 100 QT Interval:  446 QTC Calculation: 430 R Axis:   85 Text Interpretation:  Sinus bradycardia with Premature atrial complexes Otherwise normal ECG No previous ECGs available Confirmed by Vanetta MuldersZackowski, Scott [phone]([id number]) on 05/22/2019 9:36:38 PM   Radiology No results found.  Procedures Procedures (including critical care time)  Medications Ordered in ED Medications  sodium chloride flush (NS) 0.9 % injection 3 mL (has no administration in time range)  loperamide (IMODIUM) capsule 2 mg (2 mg Oral Given 05/23/19 0043)     Initial Impression / Assessment and Plan / ED Course  I have reviewed the triage vital signs and the nursing notes.  Pertinent labs & imaging results that were available during my care of the patient were reviewed by me and considered in my medical decision making (see chart for details).        68 year old male presents to the emergency department tonight because he is homeless.  He is complaining of bilateral foot pain for which she was seen on 05/18/2019 and 05/19/2019.  Was prescribed an antibiotic for management of cellulitis, though he does not appear to have an acute infection to his legs today.  No fevers or leukocytosis noted.  Also complaining of diarrhea which is chronic for him.  He has been eating a sandwich without issue.  Labs today without concern for emergent process.  Patient has been given a resource guide for shelters in the area.  He has been encouraged to follow-up with a primary care doctor.  [facility]  Return precautions discussed and provided. Patient discharged in stable condition with  no unaddressed concerns.   Final Clinical Impressions(s) / ED Diagnoses   Final diagnoses:  Bilateral foot pain  Diarrhea, unspecified type  Homelessness    ED Discharge Orders    None       Antony MaduraHumes, Cionna Collantes, PA-C 05/23/19 0701    Nira Connardama, Pedro Eduardo, MD 05/24/19 0010

## 2019-05-23 ENCOUNTER — Other Ambulatory Visit: Payer: Self-pay

## 2019-05-23 DIAGNOSIS — Z590 Homelessness: Secondary | ICD-10-CM | POA: Insufficient documentation

## 2019-05-23 DIAGNOSIS — M7989 Other specified soft tissue disorders: Secondary | ICD-10-CM | POA: Insufficient documentation

## 2019-05-23 DIAGNOSIS — R197 Diarrhea, unspecified: Secondary | ICD-10-CM | POA: Insufficient documentation

## 2019-05-23 DIAGNOSIS — F17210 Nicotine dependence, cigarettes, uncomplicated: Secondary | ICD-10-CM | POA: Insufficient documentation

## 2019-05-23 MED ORDER — DOXYCYCLINE HYCLATE 100 MG PO CAPS: 100.0000 mg | ORAL_CAPSULE | Freq: Two times a day (BID) | ORAL | 0 refills | Status: AC

## 2019-05-23 MED FILL — DOXYCYCLINE HYCLATE 100 MG: 100 | 7 days supply | Qty: 14 | Fill #0

## 2019-05-23 NOTE — Progress Notes (Signed)
CSW consulted with Advance Care Supervisor on duty about this patient. Patient was seen by PT who reports patient is able to ambulate with and without his rolling walker and is too high functioning for skilled. Per RN and NTs, patient has been ambulating on his own. CSW and Advance Care supervisor agree that there is no reason to kept patient in the ED. CSW spoke with patient and patient stated [place] When asked what happened to his shoe he reports "they were old. They had holes in them, so I just left them. There were no good." Patient reports he is able to get to a neighborhood "near [place] [employer] where he has friends and people he could stay with.   CSW provided patient with a new [place] letter and $3 (approved by Advance Care Supervisor) for patient to get his medication). CSW also provided patient with flip flops to wear and asked RN to give patient scrubs to be discharged in. CSW retrieved patient's RX for doxycyline from the [facility]. Patient reports he is able to take the bus to get to where he is going.   [place], LCSW Transitions of Care Department [facility] ED [phone]

## 2019-05-23 NOTE — ED Notes (Signed)
Pt not expired

## 2019-05-23 NOTE — Evaluation (Signed)
Physical Therapy Evaluation Patient Details Name: Russell Johnston MRN: [id number] DOB: [birth year] Today's Date: 05/23/2019   History of Present Illness  Pt presents via [facility] for evaluation of foot pain. Pt was seen at [facility] on 05/18/2019 for same . Patient was recently  discharged form [place] and unable to get medications. Pt is currently homeless.  Clinical Impression  Patient observed ambulating  In hall holding RW in air after having had a shower. Patient ambulated with an old RW and without it. No obvious balance loss. Replaced back RW legs with good rubber tips. No further PT needs.    Follow Up Recommendations No PT follow up    Equipment Recommendations  None recommended by PT(has an ole RW,says it's his)    Recommendations for Other Services       Precautions / Restrictions Precautions Precautions: Fall      Mobility  Bed Mobility Overal bed mobility: Independent                Transfers Overall transfer level: Modified independent                  Ambulation/Gait Ambulation/Gait assistance: Modified independent (Device/Increase time) Gait Distance (Feet): 120 Feet Assistive device: Rolling walker (2 wheeled);None Gait Pattern/deviations: Step-through pattern Gait velocity: decr   General Gait Details: pt. ambuklated with Rw x 60',  and then held Rw in air to amb x 60'.  Stairs            Wheelchair Mobility    Modified Rankin (Stroke Patients Only)       Balance                                             Pertinent Vitals/Pain Pain Assessment: No/denies pain    Home Living Family/patient expects to be discharged to:: Shelter/Homeless                      Prior Function Level of Independence: Independent with assistive device(s)               Hand Dominance        Extremity/Trunk Assessment   Upper Extremity Assessment Upper Extremity Assessment: Overall WFL for tasks assessed     Lower Extremity Assessment Lower Extremity Assessment: Generalized weakness    Cervical / Trunk Assessment Cervical / Trunk Assessment: Kyphotic  Communication   Communication: No difficulties  Cognition Arousal/Alertness: Awake/alert Behavior During Therapy: WFL for tasks assessed/performed Overall Cognitive Status: Within Functional Limits for tasks assessed                                 General Comments: off by 1 day for day of week      General Comments      Exercises     Assessment/Plan    PT Assessment Patent does not need any further PT services  PT Problem List         PT Treatment Interventions      PT Goals (Current goals can be found in the Care Plan section)  Acute Rehab PT Goals Patient Stated Goal: agreed to ambulate PT Goal Formulation: All assessment and education complete, DC therapy    Frequency     Barriers to discharge        Co-evaluation  AM-PAC PT "6 Clicks" Mobility  Outcome Measure Help needed turning from your back to your side while in a flat bed without using bedrails?: None Help needed moving from lying on your back to sitting on the side of a flat bed without using bedrails?: None Help needed moving to and from a bed to a chair (including a wheelchair)?: None Help needed standing up from a chair using your arms (e.g., wheelchair or bedside chair)?: None Help needed to walk in hospital room?: None Help needed climbing 3-5 steps with a railing? : A Little 6 Click Score: 23    End of Session   Activity Tolerance: Patient tolerated treatment well Patient left: in bed Nurse Communication: Mobility status PT Visit Diagnosis: Unsteadiness on feet (R26.81)    Time: 2952-84131218-1240 PT Time Calculation (min) (ACUTE ONLY): 22 min   Charges:   PT Evaluation $PT Eval Low Complexity: 1 Low          Blanchard KelchKaren Clayborne Divis PT Acute Rehabilitation Services Pager [phone] Office [phone]   Rada HayHill,  Franz Svec Elizabeth 05/23/2019, 1:45 PM

## 2019-05-23 NOTE — TOC Initial Note (Signed)
Transition of Care [facility]) - Initial/Assessment Note    Patient Details  Name: Russell Johnston MRN: [id number] Date of Birth: [birth year]  [facility] CM/SW Contact:    Janace Hoard, LCSW Phone Number: 05/23/2019, 10:06 AM  Clinical Narrative:                 CSW spoke with patient via bedside. CSW asked what happened to his [facility] letter and what barriers led him to not get his medication. Patient reports he could not find the pharmacy and the [facility] letter got "wet and dirty" so he left it somewhere. Patient reports he is agreeable to go to and participate in a SNF the recommendation for SNF is made by PT. CSW explained that if patient is not accepted into a SNF, patient will have to go a homeless shelter.   Expected Discharge Plan: Skilled Nursing Facility Barriers to Discharge: PT recommendation needed, patient is homeless which means he may not have a safe discharge plan   Patient Goals and CMS Choice Patient states their goals for this hospitalization and ongoing recovery are:: be able to walk without pain      Expected Discharge Plan and Services Expected Discharge Plan: [place] In-house Referral: Clinical Social Work     Living arrangements for the past 2 months: Homeless                                      Prior Living Arrangements/Services Living arrangements for the past 2 months: Homeless Lives with:: Self Patient language and need for interpreter reviewed:: Yes Do you feel safe going back to the place where you live?: Yes          Current home services: DME Criminal Activity/Legal Involvement Pertinent to Current Situation/Hospitalization: No - Comment as needed  Activities of Daily Living      Permission Sought/Granted Permission sought to share information with : Facility Art therapist granted to share information with : Yes, Verbal Permission Granted     Permission granted to share info w AGENCY:  SNFs        Emotional Assessment Appearance:: Appears stated age Attitude/Demeanor/Rapport: Engaged Affect (typically observed): Accepting, Hopeful, Calm, Pleasant Orientation: : Oriented to Self, Oriented to Place, Oriented to  Time, Oriented to Situation      Admission diagnosis:  FOOT PAIN There are no active problems to display for this patient.  PCP:  Patient, No Pcp Per Pharmacy:   [id number] - [place], [place] [address] [place] [address] Phone: [phone] Fax: [phone]     Social Determinants of Health (SDOH) Interventions    Readmission Risk Interventions No flowsheet data found.

## 2019-05-23 NOTE — Discharge Instructions (Signed)
You have been provided a list of shelters in the area.  You may take Tylenol or ibuprofen for management of ongoing foot pain.  Follow-up with a primary care doctor for evaluation of your ongoing complaints.

## 2019-05-24 ENCOUNTER — Encounter (HOSPITAL_COMMUNITY): Payer: Self-pay

## 2019-05-24 ENCOUNTER — Encounter (HOSPITAL_COMMUNITY): Payer: Self-pay | Admitting: Emergency Medicine

## 2019-05-24 ENCOUNTER — Other Ambulatory Visit: Payer: Self-pay

## 2019-05-24 ENCOUNTER — Emergency Department (HOSPITAL_COMMUNITY)
Admission: EM | Admit: 2019-05-24 | Discharge: 2019-05-24 | Disposition: A | Discharge status: Home / Self Care | Payer: Self-pay | Attending: Emergency Medicine | Admitting: Emergency Medicine

## 2019-05-24 ENCOUNTER — Emergency Department (HOSPITAL_COMMUNITY): Payer: Self-pay

## 2019-05-24 DIAGNOSIS — R079 Chest pain, unspecified: Secondary | ICD-10-CM | POA: Insufficient documentation

## 2019-05-24 DIAGNOSIS — Z5900 Homelessness unspecified: Secondary | ICD-10-CM

## 2019-05-24 DIAGNOSIS — R6889 Other general symptoms and signs: Secondary | ICD-10-CM | POA: Insufficient documentation

## 2019-05-24 DIAGNOSIS — F17210 Nicotine dependence, cigarettes, uncomplicated: Secondary | ICD-10-CM | POA: Insufficient documentation

## 2019-05-24 DIAGNOSIS — M7989 Other specified soft tissue disorders: Secondary | ICD-10-CM

## 2019-05-24 LAB — BASIC METABOLIC PANEL
Anion gap: 8 (ref 5–15)
BUN: 8 mg/dL (ref 8–23)
CO2: 25 mmol/L (ref 22–32)
Calcium: 9 mg/dL (ref 8.9–10.3)
Chloride: 108 mmol/L (ref 98–111)
Creatinine, Ser: 0.97 mg/dL (ref 0.61–1.24)
GFR calc Af Amer: >60 mL/min (ref >60)
GFR calc non Af Amer: >60 mL/min (ref >60)
Glucose, Bld: 115 mg/dL — ABNORMAL HIGH (ref 70–99)
Potassium: 3.3 mmol/L — ABNORMAL LOW (ref 3.5–5.1)
Sodium: 141 mmol/L (ref 135–145)

## 2019-05-24 LAB — CBC
HCT: 38.8 % — ABNORMAL LOW (ref 39.0–52.0)
HCT: 35.9 % — ABNORMAL LOW (ref 39.0–52.0)
Hemoglobin: 13 g/dL (ref 13.0–17.0)
Hemoglobin: 12 g/dL — ABNORMAL LOW (ref 13.0–17.0)
MCH: 28.5 pg (ref 26.0–34.0)
MCH: 28.4 pg (ref 26.0–34.0)
MCHC: 33.5 g/dL (ref 30.0–36.0)
MCHC: 33.4 g/dL (ref 30.0–36.0)
MCV: 85.1 fL (ref 80.0–100.0)
MCV: 85.1 fL (ref 80.0–100.0)
Platelets: 339 10*3/uL (ref 150–400)
Platelets: 333 10*3/uL (ref 150–400)
RBC: 4.56 MIL/uL (ref 4.22–5.81)
RBC: 4.22 MIL/uL (ref 4.22–5.81)
RDW: 15.2 % (ref 11.5–15.5)
RDW: 15.2 % (ref 11.5–15.5)
WBC: 4.7 10*3/uL (ref 4.0–10.5)
WBC: 4.1 10*3/uL (ref 4.0–10.5)
nRBC: 0 % (ref 0.0–0.2)
nRBC: 0 % (ref 0.0–0.2)

## 2019-05-24 LAB — COMPREHENSIVE METABOLIC PANEL
ALT: 38 U/L (ref 0–44)
AST: 43 U/L — ABNORMAL HIGH (ref 15–41)
Albumin: 3.5 g/dL (ref 3.5–5.0)
Alkaline Phosphatase: 55 U/L (ref 38–126)
Anion gap: 8 (ref 5–15)
BUN: 14 mg/dL (ref 8–23)
CO2: 26 mmol/L (ref 22–32)
Calcium: 9.1 mg/dL (ref 8.9–10.3)
Chloride: 105 mmol/L (ref 98–111)
Creatinine, Ser: 1.12 mg/dL (ref 0.61–1.24)
GFR calc Af Amer: >60 mL/min (ref >60)
GFR calc non Af Amer: >60 mL/min (ref >60)
Glucose, Bld: 90 mg/dL (ref 70–99)
Potassium: 4.3 mmol/L (ref 3.5–5.1)
Sodium: 139 mmol/L (ref 135–145)
Total Bilirubin: 0.9 mg/dL (ref 0.3–1.2)
Total Protein: 6.9 g/dL (ref 6.5–8.1)

## 2019-05-24 LAB — TROPONIN I (HIGH SENSITIVITY)
Troponin I (High Sensitivity): 6 ng/L (ref <18)
Troponin I (High Sensitivity): 7 ng/L (ref <18)

## 2019-05-24 LAB — LIPASE, BLOOD: Lipase: 40 U/L (ref 11–51)

## 2019-05-24 MED ORDER — SODIUM CHLORIDE 0.9% FLUSH: 3.0000 mL | Freq: Once | INTRAVENOUS | Status: DC

## 2019-05-24 NOTE — ED Triage Notes (Signed)
C/o pain to center of chest, SOB, and mild nausea.  States he has tested negative for COVID x 3.

## 2019-05-24 NOTE — ED Provider Notes (Signed)
Emergency Department Provider Note   I have reviewed the triage vital signs and the nursing notes.   HISTORY  Chief Complaint Leg Swelling   HPI Russell Johnston is a 68 y.o. male homeless with diarrhea for 9 months an dno change today. Also with lower leg dema, unchanged.  Hasn't treid anything for symptoms.  Has seen mulitple providers (including ED many times in last few days).    No other associated or modifying symptoms.    History reviewed. No pertinent past medical history.  There are no active problems to display for this patient.   History reviewed. No pertinent surgical history.  Current Outpatient Rx  . Order #: [id number] Class: Normal    Allergies Patient has no known allergies.  History reviewed. No pertinent family history.  Social History Social History   Tobacco Use  . Smoking status: Current Some Day Smoker    Types: Cigarettes  . Smokeless tobacco: Never Used  Substance Use Topics  . Alcohol use: Yes  . Drug use: Never    Review of Systems  All other systems negative except as documented in the HPI. All pertinent positives and negatives as reviewed in the HPI. ____________________________________________   PHYSICAL EXAM:  VITAL SIGNS: ED Triage Vitals  Enc Vitals Group     BP 05/24/19 0121 (!) 150/82     Pulse Rate 05/24/19 0121 74     Resp 05/24/19 0121 16     Temp 05/24/19 0121 (!) 97.2 F (36.2 C)     Temp Source 05/24/19 0121 Oral     SpO2 05/24/19 0121 99 %    Constitutional: Alert and oriented. Well appearing and in no acute distress. Eyes: Conjunctivae are normal. PERRL. EOMI. Head: Atraumatic. Nose: No congestion/rhinnorhea. Mouth/Throat: Mucous membranes are moist.  Oropharynx non-erythematous. Neck: No stridor.  No meningeal signs.   Cardiovascular: Normal rate, regular rhythm. Good peripheral circulation. Grossly normal heart sounds.   Respiratory: Normal respiratory effort.  No retractions. Lungs CTAB.  Gastrointestinal: Soft and nontender. No distention.  usculoskeletal: No lower extremity tenderness but with significant edema. No gross deformities of extremities. Neurologic:  Normal speech and language. No gross focal neurologic deficits are appreciated.  Skin:  Skin is warm, dry and intact. No rash noted.   ____________________________________________   LABS (all labs ordered are listed, but only abnormal results are displayed)  Labs Reviewed  COMPREHENSIVE METABOLIC PANEL - Abnormal; Notable for the following components:      Result Value   AST 43 (*)    All other components within normal limits  CBC - Abnormal; Notable for the following components:   HCT 38.8 (*)    All other components within normal limits  LIPASE, BLOOD  URINALYSIS, ROUTINE W REFLEX MICROSCOPIC   ____________________________________________    INITIAL IMPRESSION / ASSESSMENT AND PLAN / ED COURSE  Likely malingering. No emergent indication for admission or further workup at this time.      Pertinent labs & imaging results that were available during my care of the patient were reviewed by me and considered in my medical decision making (see chart for details).  A medical screening exam was performed and I feel the patient has had an appropriate workup for their chief complaint at this time and likelihood of emergent condition existing is low. They have been counseled on decision, discharge, follow up and which symptoms necessitate immediate return to the emergency department. They or their family verbally stated understanding and agreement with plan and discharged in stable  condition.   ____________________________________________  FINAL CLINICAL IMPRESSION(S) / ED DIAGNOSES  Final diagnoses:  Leg swelling  Homeless     MEDICATIONS GIVEN DURING THIS VISIT:  Medications  sodium chloride flush (NS) 0.9 % injection 3 mL (has no administration in time range)     NEW OUTPATIENT MEDICATIONS  STARTED DURING THIS VISIT:  Discharge Medication List as of 05/24/2019  6:48 AM      Note:  This note was prepared with assistance of Dragon voice recognition software. Occasional wrong-word or sound-a-like substitutions may have occurred due to the inherent limitations of voice recognition software.   Curry Seefeldt, Barbara CowerJason, MD 05/24/19 [phone]

## 2019-05-24 NOTE — ED Triage Notes (Signed)
Pt c/o R leg swelling and abdominal pain. Pt reports 10/10 pain.

## 2019-05-26 ENCOUNTER — Encounter (HOSPITAL_COMMUNITY): Payer: Self-pay

## 2019-05-26 ENCOUNTER — Other Ambulatory Visit: Payer: Self-pay

## 2019-05-26 ENCOUNTER — Emergency Department (HOSPITAL_COMMUNITY)
Admission: EM | Admit: 2019-05-26 | Discharge: 2019-05-26 | Disposition: A | Discharge status: Home / Self Care | Payer: Self-pay | Attending: Emergency Medicine | Admitting: Emergency Medicine

## 2019-05-26 DIAGNOSIS — F17210 Nicotine dependence, cigarettes, uncomplicated: Secondary | ICD-10-CM | POA: Insufficient documentation

## 2019-05-26 DIAGNOSIS — M79605 Pain in left leg: Secondary | ICD-10-CM | POA: Insufficient documentation

## 2019-05-26 DIAGNOSIS — M79604 Pain in right leg: Secondary | ICD-10-CM | POA: Insufficient documentation

## 2019-05-26 DIAGNOSIS — Z79899 Other long term (current) drug therapy: Secondary | ICD-10-CM | POA: Insufficient documentation

## 2019-05-26 DIAGNOSIS — Z20828 Contact with and (suspected) exposure to other viral communicable diseases: Secondary | ICD-10-CM | POA: Insufficient documentation

## 2019-05-26 LAB — SARS CORONAVIRUS 2 BY RT PCR (HOSPITAL ORDER, PERFORMED IN ~~LOC~~ HOSPITAL LAB): SARS Coronavirus 2: NEGATIVE

## 2019-05-26 NOTE — Progress Notes (Signed)
CSW spoke with patient via bedside and informed him that there is a shelter bed available at [facility] in [place]. CSW asked if patient was agreeable to go and patient stated that he was. CSW made EDP and RN aware that patient will need a COVID test and once those results come back he can go via taxi to [facility].   Golden Circle, LCSW Transitions of Care Department [facility] ED [phone]

## 2019-05-26 NOTE — ED Notes (Signed)
This Probation officer spoke with Education officer, museum. They are attempting to find patient shelter placement before discharge.   Social worker will notify this Probation officer on plan for patient placement.

## 2019-05-26 NOTE — ED Provider Notes (Signed)
[facility] EMERGENCY [facility]PARTMENT Provider Note   CSN: [id number] Arrival date & time: 05/24/19  1633     History   Chief Complaint Chief Complaint  Patient presents with  . Chest Pain    HPI Russell Johnston is a 68 y.o. male.     HPI   [age] year old male with multiple complaints.  Triage note says chest pain although patient does not specifically report this to me.  He is tells me that he just hurts all over.  He is not clear with regards to exact onset of symptoms.  Generally a poor historian.  He is requesting a Child psychotherapistsocial worker.  History reviewed. No pertinent past medical history.  There are no active problems to display for this patient.   History reviewed. No pertinent surgical history.      Home Medications    Prior to Admission medications   Medication Sig Start Date End Date Taking? Authorizing Provider  acetaminophen (TYLENOL) 325 MG tablet Take 650 mg by mouth every 6 (six) hours as needed for mild pain or headache.    [provider]  doxycycline (VIBRAMYCIN) 100 MG capsule Take 1 capsule (100 mg total) by mouth 2 (two) times daily for 7 days. 05/23/19 05/30/19  Melene PlanFloyd, Dan, DO    Family History No family history on file.  Social History Social History   Tobacco Use  . Smoking status: Current Some Day Smoker    Types: Cigarettes  . Smokeless tobacco: Never Used  Substance Use Topics  . Alcohol use: Yes  . Drug use: Never     Allergies   Patient has no known allergies.   Review of Systems Review of Systems  All systems reviewed and negative, other than as noted in HPI.  Physical Exam Updated Vital Signs BP 137/75   Pulse 78   Temp 98.8 F (37.1 C) (Oral)   Resp 14   SpO2 96%   Physical Exam Vitals signs and nursing note reviewed.  Constitutional:      General: He is not in acute distress.    Appearance: He is well-developed.  HENT:     Head: Normocephalic and atraumatic.  Eyes:     General:        Right  eye: No discharge.        Left eye: No discharge.     Conjunctiva/sclera: Conjunctivae normal.  Neck:     Musculoskeletal: Neck supple.  Cardiovascular:     Rate and Rhythm: Normal rate and regular rhythm.     Heart sounds: Normal heart sounds. No murmur. No friction rub. No gallop.   Pulmonary:     Effort: Pulmonary effort is normal. No respiratory distress.     Breath sounds: Normal breath sounds.  Abdominal:     General: There is no distension.     Palpations: Abdomen is soft.     Tenderness: There is no abdominal tenderness.  Musculoskeletal:        General: No tenderness.  Skin:    General: Skin is warm and dry.  Neurological:     Mental Status: He is alert.  Psychiatric:        Behavior: Behavior normal.        Thought Content: Thought content normal.      ED Treatments / Results  Labs (all labs ordered are listed, but only abnormal results are displayed) Labs Reviewed  BASIC METABOLIC PANEL - Abnormal; Notable for the following components:      Result Value  Potassium 3.3 (*)    Glucose, Bld 115 (*)    All other components within normal limits  CBC - Abnormal; Notable for the following components:   Hemoglobin 12.0 (*)    HCT 35.9 (*)    All other components within normal limits  TROPONIN I (HIGH SENSITIVITY)  TROPONIN I (HIGH SENSITIVITY)  TROPONIN I (HIGH SENSITIVITY)  TROPONIN I (HIGH SENSITIVITY)    EKG EKG Interpretation  Date/Time:  Sunday May 24 2019 16:51:30 EDT Ventricular Rate:  94 PR Interval:  152 QRS Duration: 88 QT Interval:  358 QTC Calculation: 447 R Axis:   66 Text Interpretation:  Normal sinus rhythm Cannot rule out Inferior infarct , age undetermined Abnormal ECG Confirmed by Gracemarie Skeet ([id number]) on 05/24/2019 7:26:00 PM Also confirmed by Eleanor Dimichele ([id number]), editor [place], Beverly ([id number])  on 05/25/2019 7:49:59 AM   Radiology Dg Chest 2 View  Result Date: 05/24/2019 CLINICAL DATA:  Chest pain and shortness of  breath.  Nausea. EXAM: CHEST - 2 VIEW COMPARISON:  None. FINDINGS: The heart size and mediastinal contours are within normal limits. Both lungs are clear. The visualized skeletal structures are unremarkable. IMPRESSION: No active cardiopulmonary disease. Electronically Signed   By: James  Maxwell M.D.   On: 05/24/2019 17:15    Procedures Procedures (including critical care time)  Medications Ordered in ED Medications - No data to display   Initial Impression / Assessment and Plan / ED Course  I have reviewed the triage vital signs and the nursing notes.  Pertinent labs & imaging results that were available during my care of the patient were reviewed by me and considered in my medical decision making (see chart for details).        68  year old male with numerous complaints.  Apparently had some chest pain although was not specifically setting this to me.  Regardless, his work-up today is fairly unremarkable.  I suspect there may be some malingering going on.  He is homeless.  Multiple recent ED visits.  These were reviewed.  Pretty unremarkable as well.  He is requesting a Education officer, museum but none is readily available over the weekend at this time.  Outpatient resources. Final Clinical Impressions(s) / ED Diagnoses   Final diagnoses:  Multiple complaints  Chest pain, unspecified type    ED Discharge Orders    None       Virgel Manifold, MD 05/26/19 [phone]

## 2019-05-26 NOTE — ED Notes (Signed)
Attempted to obtain shoes for pt, due to size we are unable to provide, spoke with Dr Zenia Resides who requested post op shoes for pt to wear. Also, diet order and social work consult put in to assist pt with resources and to answer pts questions.

## 2019-05-26 NOTE — ED Triage Notes (Signed)
Pt [place]IB [facility] [place] [facility] [place] c/o bilateral leg pain after walking all night. He denies new injury. Endorses chronic knee pain. Ambulatory.

## 2019-05-26 NOTE — ED Provider Notes (Signed)
[facility]-EMERGENCY DEPT Provider Note   CSN: [id number] Arrival date & time: 05/26/19  [id number]     History   Chief Complaint Chief Complaint  Patient presents with  . Leg Pain    HPI Russell Johnston is a 68 y.o. male.     68 year old male presents with bilateral lower extremity pain which started after walking for great distance.  Patient denies any chest pain or shortness of breath.  States he has chronic lower extremity edema.  States compliance with his medications.  Feels tired at this time.  No other complaints     History reviewed. No pertinent past medical history.  There are no active problems to display for this patient.   History reviewed. No pertinent surgical history.      Home Medications    Prior to Admission medications   Medication Sig Start Date End Date Taking? Authorizing Provider  doxycycline (VIBRAMYCIN) 100 MG capsule Take 1 capsule (100 mg total) by mouth 2 (two) times daily for 7 days. 05/23/19 05/30/19  Melene PlanFloyd, Dan, DO    Family History History reviewed. No pertinent family history.  Social History Social History   Tobacco Use  . Smoking status: Current Some Day Smoker    Types: Cigarettes  . Smokeless tobacco: Never Used  Substance Use Topics  . Alcohol use: Yes  . Drug use: Never     Allergies   Patient has no known allergies.   Review of Systems Review of Systems  All other systems reviewed and are negative.    Physical Exam Updated Vital Signs BP 132/87 (BP Location: Right Arm)   Pulse 84   Temp 97.7 F (36.5 C) (Oral)   Resp 18   Ht 1.93 m (6\' 4" )   Wt 111.6 kg   SpO2 99%   BMI 29.94 kg/m   Physical Exam Vitals signs and nursing note reviewed.  Constitutional:      General: He is not in acute distress.    Appearance: Normal appearance. He is well-developed. He is not toxic-appearing.  HENT:     Head: Normocephalic and atraumatic.  Eyes:     General: Lids are normal.   Conjunctiva/sclera: Conjunctivae normal.     Pupils: Pupils are equal, round, and reactive to light.  Neck:     Musculoskeletal: Normal range of motion and neck supple.     Thyroid: No thyroid mass.     Trachea: No tracheal deviation.  Cardiovascular:     Rate and Rhythm: Normal rate and regular rhythm.     Heart sounds: Normal heart sounds. No murmur. No gallop.   Pulmonary:     Effort: Pulmonary effort is normal. No respiratory distress.     Breath sounds: Normal breath sounds. No stridor. No decreased breath sounds, wheezing, rhonchi or rales.  Abdominal:     General: Bowel sounds are normal. There is no distension.     Palpations: Abdomen is soft.     Tenderness: There is no abdominal tenderness. There is no rebound.  Musculoskeletal: Normal range of motion.        General: No tenderness.  Feet:     Comments: No blisters appreciated.  Dorsalis pedis pulses palpable bilateral. Lymphadenopathy:     Comments: Bilateral le 3+  Skin:    General: Skin is warm and dry.     Findings: No abrasion or rash.  Neurological:     Mental Status: He is alert and oriented to person, place, and time.  GCS: GCS eye subscore is 4. GCS verbal subscore is 5. GCS motor subscore is 6.     Cranial Nerves: No cranial nerve deficit.     Sensory: No sensory deficit.  Psychiatric:        Speech: Speech normal.        Behavior: Behavior normal.      ED Treatments / Results  Labs (all labs ordered are listed, but only abnormal results are displayed) Labs Reviewed - No data to display  EKG None  Radiology Dg Chest 2 View  Result Date: 05/24/2019 CLINICAL DATA:  Chest pain and shortness of breath.  Nausea. EXAM: CHEST - 2 VIEW COMPARISON:  None. FINDINGS: The heart size and mediastinal contours are within normal limits. Both lungs are clear. The visualized skeletal structures are unremarkable. IMPRESSION: No active cardiopulmonary disease. Electronically Signed   By: Lorriane Shire M.D.   On:  05/24/2019 17:15    Procedures Procedures (including critical care time)  Medications Ordered in ED Medications - No data to display   Initial Impression / Assessment and Plan / ED Course  I have reviewed the triage vital signs and the nursing notes.  Pertinent labs & imaging results that were available during my care of the patient were reviewed by me and considered in my medical decision making (see chart for details).        Patient with dependent edema from ambulating great distances.  No evidence of infection at this time.  Will have social work to see and patient started to follow-up with his doctor  Final Clinical Impressions(s) / ED Diagnoses   Final diagnoses:  None    ED Discharge Orders    None       Lacretia Leigh, MD 05/26/19 0800

## 2019-05-28 ENCOUNTER — Other Ambulatory Visit: Payer: Self-pay

## 2019-05-28 ENCOUNTER — Emergency Department (HOSPITAL_COMMUNITY)
Admission: EM | Admit: 2019-05-28 | Discharge: 2019-05-29 | Disposition: A | Discharge status: Home / Self Care | Payer: Non-veteran care | Attending: Emergency Medicine | Admitting: Emergency Medicine

## 2019-05-28 ENCOUNTER — Emergency Department (HOSPITAL_COMMUNITY): Payer: Non-veteran care

## 2019-05-28 DIAGNOSIS — Z5900 Homelessness unspecified: Secondary | ICD-10-CM

## 2019-05-28 DIAGNOSIS — R600 Localized edema: Secondary | ICD-10-CM | POA: Insufficient documentation

## 2019-05-28 DIAGNOSIS — Z720 Tobacco use: Secondary | ICD-10-CM | POA: Insufficient documentation

## 2019-05-28 DIAGNOSIS — M7989 Other specified soft tissue disorders: Secondary | ICD-10-CM

## 2019-05-28 NOTE — ED Triage Notes (Signed)
Pt to ED via [facility] from homeless shelter in [facility] with c/o shortness of breath.  Pt also c/o bil knee and back pain (chronic)

## 2019-05-29 ENCOUNTER — Other Ambulatory Visit: Payer: Self-pay

## 2019-05-29 NOTE — ED Notes (Signed)
Pt discharge instructions reviewed with the patient. Pt verbalized understanding of instructions. Pt unable to sign due to signature pad not responding. Pt discharged.

## 2019-05-29 NOTE — ED Notes (Signed)
IV has been taken out 

## 2019-05-29 NOTE — Discharge Planning (Signed)
[facility] met with pt at bedside regarding disposition plan.  Pt states he needs transportation and list of homeless shelters. EDCM advised pt to visit [place] until he can get into shelter, pt states he is not allowed back at [facility].  EDCM advised pt to visit [facility] on [facility], pt agreeable.  EDCM advised pt to utilize [employer] as the fees are waived at this time.  Pt states if someone will call a cab, he will pay for it.  EDCM inquired about medications, pt states he has medications and is not in need of any at this time.  No further EDCM needs identified at this time.

## 2019-05-29 NOTE — ED Provider Notes (Signed)
[facility] EMERGENCY DEPARTMENT Provider Note   CSN: [id number] Arrival date & time: 05/28/19  2151     History   Chief Complaint Chief Complaint  Patient presents with  . Shortness of Breath    HPI Russell Johnston is a 68 y.o. male who is homeless presenting with nonspecific complaints of lower leg swelling.  Patient reportedly told intake when he arrived that he was having shortness of breath, subsequently had a chest x-ray and EKG performed.  Unfortunately due to long waiting times the patient was in the waiting room for approximately 11 hours prior to being roomed and seen by me on the morning shift.  At the time of my exam, the patient has no complaints aside from "my feet feel swollen".  He is asking for food and wants to see a Education officer, museum, stating that he does not like the shelter he was sent to previously.  He denies to me that he is having any chest pain or shortness of breath.    Of note the patient is been seen multiple times in the past week on a near daily basis for a variety of complaints including chest pain and SOB.  He has had a cardiac work-up with negative troponins as recently as 5 days ago on 05/24/2019.  He also had a negative coronavirus test on 05/26/2019.  He was seen by social work on his most recent visit 2 days ago who helped him with placement.  He denies any fevers, chills, pus or drainage from his feet.     HPI  No past medical history on file.  There are no active problems to display for this patient.   No past surgical history on file.      Home Medications    Prior to Admission medications   Medication Sig Start Date End Date Taking? Authorizing Provider  acetaminophen (TYLENOL) 325 MG tablet Take 650 mg by mouth every 6 (six) hours as needed for mild pain or headache.    [provider]  doxycycline (VIBRAMYCIN) 100 MG capsule Take 1 capsule (100 mg total) by mouth 2 (two) times daily for 7 days. 05/23/19 05/30/19   Deno Etienne, DO    Family History No family history on file.  Social History Social History   Tobacco Use  . Smoking status: Current Some Day Smoker    Types: Cigarettes  . Smokeless tobacco: Never Used  Substance Use Topics  . Alcohol use: Yes  . Drug use: Never     Allergies   Patient has no known allergies.   Review of Systems Review of Systems  Constitutional: Negative for chills and fever.  Respiratory: Negative for cough and shortness of breath.   Cardiovascular: Negative for chest pain and palpitations.  Musculoskeletal: Positive for arthralgias and myalgias.  Skin: Negative for rash and wound.  Neurological: Negative for seizures and syncope.  All other systems reviewed and are negative.    Physical Exam Updated Vital Signs BP (!) 146/90 (BP Location: Right Arm)   Pulse 74   Temp 98 F (36.7 C) (Oral)   Resp 16   Ht 6\' 4"  (1.93 m)   Wt 111.6 kg   SpO2 98%   BMI 29.94 kg/m   Physical Exam Vitals signs and nursing note reviewed.  Constitutional:      Appearance: He is well-developed.  HENT:     Head: Normocephalic and atraumatic.  Eyes:     Conjunctiva/sclera: Conjunctivae normal.  Neck:  Musculoskeletal: Neck supple.  Cardiovascular:     Rate and Rhythm: Normal rate and regular rhythm.     Heart sounds: No murmur.  Pulmonary:     Effort: Pulmonary effort is normal. No respiratory distress.     Breath sounds: Normal breath sounds.  Abdominal:     Palpations: Abdomen is soft.     Tenderness: There is no abdominal tenderness.  Skin:    General: Skin is warm and dry.     Comments: Mild symmetrical pitting edema of lower extremities  Neurological:     Mental Status: He is alert.      ED Treatments / Results  Labs (all labs ordered are listed, but only abnormal results are displayed) Labs Reviewed - No data to display  EKG  Radiology Dg Chest 2 View  Result Date: 05/28/2019 CLINICAL DATA:  Shortness of breath EXAM: CHEST - 2  VIEW COMPARISON:  05/24/2019 FINDINGS: The heart size and mediastinal contours are within normal limits. Both lungs are clear. The visualized skeletal structures are unremarkable. IMPRESSION: No active cardiopulmonary disease. Electronically Signed   By: Jasmine PangKim  Fujinaga M.D.   On: 05/28/2019 22:28    Procedures Procedures (including critical care time)  Medications Ordered in ED Medications - No data to display   Initial Impression / Assessment and Plan / ED Course  I have reviewed the triage vital signs and the nursing notes.  Pertinent labs & imaging results that were available during my care of the patient were reviewed by me and considered in my medical decision making (see chart for details).  This patient is a 68 year old gentleman with a history of homelessness and frequent emergency department visits presenting to the emergency department tonight complaining of bilateral foot pain and swelling.  Patient spent most of the evening in the waiting room fortunately had a multiple hour wait prior to being seen by the provider.  Although he initially reported shortness of breath at triage on my exam he denies that he feels shortness of breath or chest pain, and is directly asking me for lunch and to see a Child psychotherapistsocial worker.  He states that his feet feel sore.  No acute concerns for infection at this time.  He has very mild pitting edema lower extremities.  He has palpable pedal pulses.  We will give him some food here and I will reach out to our Child psychotherapistsocial worker.     Final Clinical Impressions(s) / ED Diagnoses   Final diagnoses:  Homelessness  Leg swelling    ED Discharge Orders    None       Terald Sleeperrifan, Chrystine Frogge J, MD 05/29/19 1904

## 2019-05-30 ENCOUNTER — Encounter (HOSPITAL_COMMUNITY): Payer: Self-pay

## 2019-05-30 ENCOUNTER — Emergency Department (HOSPITAL_COMMUNITY)
Admission: EM | Admit: 2019-05-30 | Discharge: 2019-05-30 | Disposition: A | Discharge status: Home / Self Care | Payer: Non-veteran care | Attending: Emergency Medicine | Admitting: Emergency Medicine

## 2019-05-30 ENCOUNTER — Other Ambulatory Visit: Payer: Self-pay

## 2019-05-30 DIAGNOSIS — M542 Cervicalgia: Secondary | ICD-10-CM | POA: Insufficient documentation

## 2019-05-30 DIAGNOSIS — F17210 Nicotine dependence, cigarettes, uncomplicated: Secondary | ICD-10-CM | POA: Insufficient documentation

## 2019-05-30 DIAGNOSIS — Z590 Homelessness: Secondary | ICD-10-CM | POA: Insufficient documentation

## 2019-05-30 DIAGNOSIS — Z711 Person with feared health complaint in whom no diagnosis is made: Secondary | ICD-10-CM

## 2019-05-30 MED ORDER — BACITRACIN ZINC 500 UNIT/GM EX OINT
TOPICAL_OINTMENT | CUTANEOUS | Status: AC
  Filled 2019-05-30: qty 0.9

## 2019-05-30 MED ORDER — ACETAMINOPHEN 325 MG PO TABS
650.0000 mg | ORAL_TABLET | Freq: Once | ORAL | Status: AC
  Administered 2019-05-30: 12:00:00 650 mg via ORAL
  Filled 2019-05-30: qty 2

## 2019-05-30 NOTE — ED Provider Notes (Signed)
[facility] DEPT Provider Note   CSN: [id number] Arrival date & time: 05/30/19  1733     History   Chief Complaint Chief Complaint  Patient presents with  . Prostate Check    HPI Russell Johnston is a 68 y.o. male.     HPI Patient presents to the emergency department with multiple complaints.  The patient states that he would like to have his prostate checked.  He states that he has not seen a doctor in quite a while.  He states his feet are also itching.  The patient has multiple other complaints.  Patient has been seen in the emergency department multiple times over the last 2 weeks.  Patient states has not followed up with a primary care doctor. History reviewed. No pertinent past medical history.  There are no active problems to display for this patient.   History reviewed. No pertinent surgical history.      Home Medications    Prior to Admission medications   Medication Sig Start Date End Date Taking? Authorizing Provider  acetaminophen (TYLENOL) 325 MG tablet Take 650 mg by mouth every 6 (six) hours as needed for mild pain or headache.    [provider]  doxycycline (VIBRAMYCIN) 100 MG capsule Take 1 capsule (100 mg total) by mouth 2 (two) times daily for 7 days. 05/23/19 05/30/19  Deno Etienne, DO    Family History No family history on file.  Social History Social History   Tobacco Use  . Smoking status: Current Some Day Smoker    Types: Cigarettes  . Smokeless tobacco: Never Used  Substance Use Topics  . Alcohol use: Yes  . Drug use: Never     Allergies   Patient has no known allergies.   Review of Systems Review of Systems All other systems negative except as documented in the HPI. All pertinent positives and negatives as reviewed in the HPI.  Physical Exam Updated Vital Signs BP 139/90 (BP Location: Right Arm)   Pulse 77   Temp 98 F (36.7 C)   Resp 14   Wt 111 kg   SpO2 100%   BMI 29.79 kg/m    Physical Exam Vitals signs and nursing note reviewed.  Constitutional:      General: He is not in acute distress.    Appearance: He is well-developed.  HENT:     Head: Normocephalic and atraumatic.  Eyes:     Pupils: Pupils are equal, round, and reactive to light.  Cardiovascular:     Rate and Rhythm: Regular rhythm. Tachycardia present.  Pulmonary:     Effort: Pulmonary effort is normal.  Skin:    General: Skin is warm and dry.  Neurological:     Mental Status: He is alert and oriented to person, place, and time.      ED Treatments / Results  Labs (all labs ordered are listed, but only abnormal results are displayed) Labs Reviewed - No data to display  EKG None  Radiology Dg Chest 2 View  Result Date: 05/28/2019 CLINICAL DATA:  Shortness of breath EXAM: CHEST - 2 VIEW COMPARISON:  05/24/2019 FINDINGS: The heart size and mediastinal contours are within normal limits. Both lungs are clear. The visualized skeletal structures are unremarkable. IMPRESSION: No active cardiopulmonary disease. Electronically Signed   By: Donavan Foil M.D.   On: 05/28/2019 22:28    Procedures Procedures (including critical care time)  Medications Ordered in ED Medications - No data to display   Initial  Impression / Assessment and Plan / ED Course  I have reviewed the triage vital signs and the nursing notes.  Pertinent labs & imaging results that were available during my care of the patient were reviewed by me and considered in my medical decision making (see chart for details).    Patient is homeless and and he has had multiple evaluations here in the emergency department.  I did advise him that his prostate will need to be evaluated on an outpatient basis.  The patient states he would like to have something to eat and drink as well.  He states he is not anything to eat or drink all day.      Final Clinical Impressions(s) / ED Diagnoses   Final diagnoses:  None    ED Discharge  Orders    None       Charlestine NightLawyer, Khalani Novoa, PA-C 05/30/19 2245    Samuel JesterMcManus, Kathleen, DO 05/31/19 1628

## 2019-05-30 NOTE — Discharge Instructions (Addendum)
You will need to follow-up with 1 of the clinics provided.  They can assess further your issues.

## 2019-05-30 NOTE — ED Provider Notes (Signed)
[facility]-EMERGENCY DEPT Provider Note   CSN: [id number] Arrival date & time: 05/30/19  [id number]     History   Chief Complaint Chief Complaint  Patient presents with  . Neck Pain    HPI Russell Johnston is a 68 y.o. male.     The history is provided by the patient.  Illness Location:  Neck  Quality:  Pain  Severity:  Mild Onset quality:  Gradual Timing:  Intermittent Progression:  Waxing and waning Chronicity:  New Context:  Neck pain to the right side of neck, for the last several day. No trauma. No numbness, no tingling down the arms. No weakness of arms.  Relieved by:  Nothing Worsened by:  Nothing Associated symptoms: no abdominal pain, no chest pain, no cough, no ear pain, no fever, no myalgias, no rash, no shortness of breath, no sore throat and no vomiting     History reviewed. No pertinent past medical history.  There are no active problems to display for this patient.   History reviewed. No pertinent surgical history.      Home Medications    Prior to Admission medications   Medication Sig Start Date End Date Taking? Authorizing Provider  acetaminophen (TYLENOL) 325 MG tablet Take 650 mg by mouth every 6 (six) hours as needed for mild pain or headache.    [provider]  doxycycline (VIBRAMYCIN) 100 MG capsule Take 1 capsule (100 mg total) by mouth 2 (two) times daily for 7 days. 05/23/19 05/30/19  Melene PlanFloyd, Dan, DO    Family History No family history on file.  Social History Social History   Tobacco Use  . Smoking status: Current Some Day Smoker    Types: Cigarettes  . Smokeless tobacco: Never Used  Substance Use Topics  . Alcohol use: Yes  . Drug use: Never     Allergies   Patient has no known allergies.   Review of Systems Review of Systems  Constitutional: Negative for chills and fever.  HENT: Negative for ear pain and sore throat.   Eyes: Negative for pain and visual disturbance.  Respiratory: Negative for  cough and shortness of breath.   Cardiovascular: Negative for chest pain and palpitations.  Gastrointestinal: Negative for abdominal pain and vomiting.  Genitourinary: Negative for dysuria and hematuria.  Musculoskeletal: Positive for neck pain. Negative for arthralgias, back pain, gait problem, joint swelling, myalgias and neck stiffness.  Skin: Negative for color change and rash.  Neurological: Negative for seizures and syncope.  All other systems reviewed and are negative.    Physical Exam Updated Vital Signs BP 98/80   Pulse (!) 58   Temp 97.8 F (36.6 C) (Oral)   Resp 18   Wt 111 kg   SpO2 100%   BMI 29.79 kg/m   Physical Exam Vitals signs and nursing note reviewed.  Constitutional:      General: He is not in acute distress.    Appearance: He is well-developed. He is not ill-appearing.  HENT:     Head: Normocephalic and atraumatic.     Nose: Nose normal.     Mouth/Throat:     Mouth: Mucous membranes are moist.  Eyes:     Extraocular Movements: Extraocular movements intact.     Conjunctiva/sclera: Conjunctivae normal.     Pupils: Pupils are equal, round, and reactive to light.  Neck:     Musculoskeletal: Normal range of motion and neck supple. Muscular tenderness present.     Comments: Tenderness to paraspinal lumbar  muscles on the right, no midline spinal tenderness Cardiovascular:     Rate and Rhythm: Normal rate and regular rhythm.     Pulses: Normal pulses.     Heart sounds: Normal heart sounds. No murmur.  Pulmonary:     Effort: Pulmonary effort is normal. No respiratory distress.     Breath sounds: Normal breath sounds.  Abdominal:     Palpations: Abdomen is soft.     Tenderness: There is no abdominal tenderness.  Musculoskeletal: Normal range of motion.        General: No tenderness.     Comments: No midline spinal tenderness  Skin:    General: Skin is warm and dry.  Neurological:     General: No focal deficit present.     Mental Status: He is alert  and oriented to person, place, and time.     Cranial Nerves: No cranial nerve deficit.     Sensory: No sensory deficit.     Motor: No weakness.     Comments: 5+ out of 5 strength throughout, normal sensation throughout      ED Treatments / Results  Labs (all labs ordered are listed, but only abnormal results are displayed) Labs Reviewed - No data to display  EKG None  Radiology Dg Chest 2 View  Result Date: 05/28/2019 CLINICAL DATA:  Shortness of breath EXAM: CHEST - 2 VIEW COMPARISON:  05/24/2019 FINDINGS: The heart size and mediastinal contours are within normal limits. Both lungs are clear. The visualized skeletal structures are unremarkable. IMPRESSION: No active cardiopulmonary disease. Electronically Signed   By: Donavan Foil M.D.   On: 05/28/2019 22:28    Procedures Procedures (including critical care time)  Medications Ordered in ED Medications  bacitracin 500 UNIT/GM ointment (  Not Given 05/30/19 1306)  acetaminophen (TYLENOL) tablet 650 mg (650 mg Oral Given 05/30/19 1229)     Initial Impression / Assessment and Plan / ED Course  I have reviewed the triage vital signs and the nursing notes.  Pertinent labs & imaging results that were available during my care of the patient were reviewed by me and considered in my medical decision making (see chart for details).     Russell Johnston is a 68 year old male with no significant medical history presents the ED with right-sided neck pain.  Patient with normal vitals.  No fever.  Patient with paraspinal cervical pain.  No midline spinal tenderness.  Denies any trauma.  Neurologically patient is intact.  Likely muscle strain.  Patient given Tylenol.  Overall no concern for acute neurological compromise.  Patient well-appearing and discharged from the ED in good condition.  Was seen here yesterday and given information for social work and outpatient follow-up as patient has been struggling with homelessness.  This chart was  dictated using voice recognition software.  Despite best efforts to proofread,  errors can occur which can change the documentation meaning.    Final Clinical Impressions(s) / ED Diagnoses   Final diagnoses:  Neck pain    ED Discharge Orders    None       Lennice Sites, DO 05/30/19 1311

## 2019-05-30 NOTE — ED Triage Notes (Signed)
Pt states that he needs prostate checked. Pt states he had a prostate issue in the past that he needs reevaluated.  Pt states there are no other issues that should require eval - the last 8 visits in the past 2 weeks have hit all his concerns.

## 2019-05-30 NOTE — ED Triage Notes (Addendum)
Pt BIBA from street. Pt has vague complaint of neck pain. [facility] wristband from yesterday, along with tape and gauze from yesterday noted. No trauma/injury/fall.

## 2019-05-30 NOTE — Discharge Instructions (Addendum)
follow up [facility] on [address]

## 2019-05-31 ENCOUNTER — Other Ambulatory Visit: Payer: Self-pay

## 2019-05-31 ENCOUNTER — Emergency Department (HOSPITAL_COMMUNITY)
Admission: EM | Admit: 2019-05-31 | Discharge: 2019-05-31 | Discharge status: Absent without leave | Payer: Non-veteran care

## 2019-05-31 ENCOUNTER — Encounter (HOSPITAL_COMMUNITY): Payer: Self-pay

## 2019-05-31 ENCOUNTER — Emergency Department (HOSPITAL_COMMUNITY)
Admission: EM | Admit: 2019-05-31 | Discharge: 2019-05-31 | Disposition: A | Discharge status: Home / Self Care | Payer: Non-veteran care | Attending: Emergency Medicine | Admitting: Emergency Medicine

## 2019-05-31 DIAGNOSIS — F17210 Nicotine dependence, cigarettes, uncomplicated: Secondary | ICD-10-CM | POA: Insufficient documentation

## 2019-05-31 DIAGNOSIS — Z5900 Homelessness unspecified: Secondary | ICD-10-CM

## 2019-05-31 NOTE — ED Notes (Signed)
Pt refused to sign at d/c.  

## 2019-05-31 NOTE — Discharge Instructions (Addendum)
Steps to find a Primary Care Provider (PCP): ° °Call [phone] or [phone] to access "[facility] Find a Doctor Service." ° °2.  You may also go on the [facility] website at [email or web address] ° °3.  [facility] also frequently accepts new patients. ° ° [facility]  °[address] °[place] [address] °[phone] ° °4.  There are also multiple [facility], [facility] and [facility] practices throughout the [place] that are frequently accepting new patients. You may find a clinic that is close to your home and contact them. ° °[facility] °[email or web address] °[phone] ° °[facility] °[email or web address] ° °[facility] °[email or web address] °[phone] ° °[facility] °[email or web address] °[phone] ° °5.  Local Health Departments also can provide primary care services. ° °[facility]  °[address] °[place] [address] °[phone] ° °[facility] °[address] °[place] [address] °[phone] ° °[facility] °[address]  °[place]  [address] °[phone] ° ° °

## 2019-05-31 NOTE — ED Triage Notes (Signed)
Pt was asked by security to leave the lobby because he has been sitting in the lobby since he was discharged last night. Pt states that he needs to check back in and be seen because he wants his depakote refilled. Endorses auditory and visual hallucinations. Denies SI/HI.

## 2019-05-31 NOTE — ED Provider Notes (Signed)
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  States he is here because "I need to see a psychiatrist".  He states he has chronic visual hallucinations but is unable to tell me what he is seeing.  He denies SI, HI, auditory hallucinations.  No paranoia or delusions.  He states he needs a refill of his medications but cannot remember the dose or frequency.  States he has been off them for several months since he left the group home in [place] in June 2020.  He states "I have nowhere to go and it is cold outside".  ROS: See HPI Constitutional: no fever  Eyes: no drainage  ENT: no runny nose   Cardiovascular:  no chest pain  Resp: no SOB  GI: no vomiting GU: no dysuria Integumentary: no rash  Allergy: no hives  Musculoskeletal: no leg swelling  Neurological: no slurred speech ROS otherwise negative  PAST MEDICAL HISTORY/PAST SURGICAL HISTORY:  No past medical history on file.  MEDICATIONS:  Prior to Admission medications   Medication Sig Start Date End Date Taking? Authorizing Provider  acetaminophen (TYLENOL) 325 MG tablet Take 650 mg by mouth every 6 (six) hours as needed for mild pain or headache.    [provider]    ALLERGIES:  No Known Allergies  SOCIAL HISTORY:  Social History   Tobacco Use  . Smoking status: Current Some Day Smoker    Types: Cigarettes  . Smokeless tobacco: Never Used  Substance Use Topics  . Alcohol use: Yes    FAMILY HISTORY: No family history on file.  EXAM: BP (!) 131/101 (BP Location: Right Arm)   Pulse 87   Temp 97.8 F (36.6 C) (Oral)   Resp 18   SpO2 98%  CONSTITUTIONAL: Alert and oriented and responds appropriately to questions.  Chronically  ill-appearing HEAD: Normocephalic EYES: Conjunctivae clear, pupils appear equal, EOMI ENT: normal nose; moist mucous membranes NECK: Supple, no meningismus, no nuchal rigidity, no LAD  CARD: RRR; S1 and S2 appreciated; no murmurs, no clicks, no rubs, no gallops RESP: Normal chest excursion without splinting or tachypnea; breath sounds clear and equal bilaterally; no wheezes, no rhonchi, no rales, no hypoxia or respiratory distress, speaking full sentences ABD/GI: Normal bowel sounds; non-distended; soft, non-tender, no rebound, no guarding, no peritoneal signs, no hepatosplenomegaly BACK:  The back appears normal and is non-tender to palpation, there is no CVA tenderness EXT: Normal ROM in all joints; non-tender to palpation; no edema; normal capillary refill; no cyanosis, no calf tenderness or swelling    SKIN: Normal color for age and race; warm; no rash NEURO: Moves all extremities equally PSYCH: Denies SI, HI or hallucinations.  No paranoia or delusions.  Appears to have normal insight.  MEDICAL DECISION MAKING: Patient here with homelessness.  He appears to be malingering with several visits in the past week for the same symptoms.  I do not feel he needs emergent psychiatric evaluation and we have discussed this.  Have offered to refill his medications but at this time he is unable to tell me his dose or frequency.  Discussed with patient that I am happy to provide him with outpatient PCP and psychiatric services.  I have also provided him with information for social  services and shelters.  I do not feel there is any emergent complaint today.  I have offered to have social work contact him for outpatient services but he states that he does not have a phone that they can contact him with.  I feel he is safe to be discharged.  At this time, I do not feel there is any life-threatening condition present. I have reviewed and discussed all results (EKG, imaging, lab, urine as appropriate) and exam  findings with patient/family. I have reviewed nursing notes and appropriate previous records.  I feel the patient is safe to be discharged home without further emergent workup and can continue workup as an outpatient as needed. Discussed usual and customary return precautions. Patient/family verbalize understanding and are comfortable with this plan.  Outpatient follow-up has been provided as needed. All questions have been answered.      Ward, Delice Bison, DO 05/31/19 [phone]

## 2019-06-02 ENCOUNTER — Other Ambulatory Visit: Payer: Self-pay

## 2019-06-02 ENCOUNTER — Emergency Department (HOSPITAL_COMMUNITY)
Admission: EM | Admit: 2019-06-02 | Discharge: 2019-06-02 | Disposition: A | Discharge status: Home / Self Care | Payer: Non-veteran care | Attending: Emergency Medicine | Admitting: Emergency Medicine

## 2019-06-02 ENCOUNTER — Encounter (HOSPITAL_COMMUNITY): Payer: Self-pay | Admitting: Emergency Medicine

## 2019-06-02 DIAGNOSIS — F17210 Nicotine dependence, cigarettes, uncomplicated: Secondary | ICD-10-CM | POA: Insufficient documentation

## 2019-06-02 DIAGNOSIS — Z5900 Homelessness unspecified: Secondary | ICD-10-CM

## 2019-06-02 DIAGNOSIS — R197 Diarrhea, unspecified: Secondary | ICD-10-CM | POA: Insufficient documentation

## 2019-06-02 DIAGNOSIS — Z720 Tobacco use: Secondary | ICD-10-CM | POA: Insufficient documentation

## 2019-06-02 DIAGNOSIS — R109 Unspecified abdominal pain: Secondary | ICD-10-CM | POA: Insufficient documentation

## 2019-06-02 DIAGNOSIS — Z760 Encounter for issue of repeat prescription: Secondary | ICD-10-CM | POA: Insufficient documentation

## 2019-06-02 MED ORDER — FLUPHENAZINE HCL 5 MG PO TABS: 5.0000 mg | ORAL_TABLET | Freq: Three times a day (TID) | ORAL | 0 refills | Status: AC

## 2019-06-02 NOTE — Progress Notes (Signed)
CSW spoke with patient via bedside about shelter needs. Patient asked if he could go back to [facility] which this CSW assisted him getting into on 9/15. Patient stated he has not been at the shelter "in a few days", so he more than likely no longer has the bed if he was not there last night. CSW explained this to patient and asked CSW to "ask them nicely if I can go back." CSW attempted to call [facility] and [facility] to see if there are available beds, but did not receive an answer. CSW left VM with [facility], [facility] VM box was full. CSW spoke with PA seeing patient and made her aware of disposition plan to having patient take the city bus to the [facility]. CSW signing off.   [place] Circle, LCSW Transitions of Care Department [facility] ED [phone]

## 2019-06-02 NOTE — Discharge Instructions (Signed)
Schedule an appointment with [facility] for regular medication refills of your psychiatric medications. Use the resources as provided by social work for shelter.

## 2019-06-02 NOTE — ED Provider Notes (Signed)
[facility] DEPT Provider Note   CSN: [id number] Arrival date & time: 06/02/19  0018     History   Chief Complaint Chief Complaint  Patient presents with  . Homeless  . Medication Refill    HPI Russell Johnston is a 68 y.o. male presenting to the emergency department with complaint of homelessness and medication refill.  Patient has been evaluated at least 10 times this month in the ED with various complaints and homelessness.  He was last seen on 05/31/2019.  Today he is requesting medication refill of his Prolixin, states he takes 5 mg 3 times daily and recently ran out.  He is also requesting assistance to getting back into the [facility] shelter.  No other medical complaints today.  Contrary to triage note, patient mentions no complaint of foot pain.     The history is provided by the patient and medical records.    History reviewed. No pertinent past medical history.  There are no active problems to display for this patient.   History reviewed. No pertinent surgical history.      Home Medications    Prior to Admission medications   Medication Sig Start Date End Date Taking? Authorizing Provider  acetaminophen (TYLENOL) 325 MG tablet Take 650 mg by mouth every 6 (six) hours as needed for mild pain or headache.    [provider]  fluPHENAZine (PROLIXIN) 5 MG tablet Take 1 tablet (5 mg total) by mouth 3 (three) times daily for 7 days. 06/02/19 06/09/19  Radonna Bracher, [place] N, PA-C    Family History History reviewed. No pertinent family history.  Social History Social History   Tobacco Use  . Smoking status: Current Some Day Smoker    Types: Cigarettes  . Smokeless tobacco: Never Used  Substance Use Topics  . Alcohol use: Yes  . Drug use: Never     Allergies   Patient has no known allergies.   Review of Systems Review of Systems  All other systems reviewed and are negative.    Physical Exam Updated Vital Signs  BP 125/82   Pulse 80   Temp 98.2 F (36.8 C) (Oral)   Resp 17   Physical Exam Vitals signs and nursing note reviewed.  Constitutional:      General: He is not in acute distress.    Appearance: He is well-developed.  HENT:     Head: Normocephalic and atraumatic.  Eyes:     Conjunctiva/sclera: Conjunctivae normal.  Cardiovascular:     Rate and Rhythm: Normal rate.  Pulmonary:     Effort: Pulmonary effort is normal.  Abdominal:     Palpations: Abdomen is soft.  Skin:    General: Skin is warm.  Neurological:     Mental Status: He is alert.  Psychiatric:        Behavior: Behavior normal.      ED Treatments / Results  Labs (all labs ordered are listed, but only abnormal results are displayed) Labs Reviewed - No data to display  EKG None  Radiology No results found.  Procedures Procedures (including critical care time)  Medications Ordered in ED Medications - No data to display   Initial Impression / Assessment and Plan / ED Course  I have reviewed the triage vital signs and the nursing notes.  Pertinent labs & imaging results that were available during my care of the patient were reviewed by me and considered in my medical decision making (see chart for details).  Patient with a least 10 visits this month in the ED for various complaints, presenting today requesting assistance getting into homeless shelter and medication refill of his Prolixin.  He has no other medical complaints today, however did complain of foot pain in triage though there was no mention of this on evaluation.  Consult placed to social work for assistance and shelter placement.  Social work provided bus pass to the [facility], mentions he was just placed at a shelter last week, however lost his bed due to leaving.  Will provide small refill of patient's medication, however encourage he follow-up outpatient with [facility] for regular refills.  Final Clinical Impressions(s) / ED Diagnoses    Final diagnoses:  Encounter for medication refill  Homelessness    ED Discharge Orders         Ordered    fluPHENAZine (PROLIXIN) 5 MG tablet  3 times daily     06/02/19 1311           Indea Dearman, [place] N, [place] 06/02/19 1312    Alvira Monday, MD 06/04/19 1123

## 2019-06-02 NOTE — ED Triage Notes (Signed)
Pt presents by EMS for evaluation of foot pain. Pt was seen several times yesterday for various complaints.

## 2019-06-02 NOTE — ED Notes (Signed)
Patient given a sandwich and water.

## 2019-06-03 ENCOUNTER — Emergency Department (HOSPITAL_COMMUNITY)
Admission: EM | Admit: 2019-06-03 | Discharge: 2019-06-03 | Disposition: A | Discharge status: Home / Self Care | Payer: No Typology Code available for payment source | Attending: Emergency Medicine | Admitting: Emergency Medicine

## 2019-06-03 ENCOUNTER — Emergency Department (HOSPITAL_COMMUNITY)
Admission: EM | Admit: 2019-06-03 | Discharge: 2019-06-03 | Disposition: A | Discharge status: Home / Self Care | Payer: Non-veteran care | Attending: Emergency Medicine | Admitting: Emergency Medicine

## 2019-06-03 ENCOUNTER — Encounter (HOSPITAL_COMMUNITY): Payer: Self-pay | Admitting: Emergency Medicine

## 2019-06-03 DIAGNOSIS — Z5900 Homelessness unspecified: Secondary | ICD-10-CM

## 2019-06-03 DIAGNOSIS — R52 Pain, unspecified: Secondary | ICD-10-CM | POA: Insufficient documentation

## 2019-06-03 DIAGNOSIS — F17210 Nicotine dependence, cigarettes, uncomplicated: Secondary | ICD-10-CM | POA: Insufficient documentation

## 2019-06-03 DIAGNOSIS — R197 Diarrhea, unspecified: Secondary | ICD-10-CM

## 2019-06-03 LAB — URINALYSIS, ROUTINE W REFLEX MICROSCOPIC
Bilirubin Urine: NEGATIVE
Glucose, UA: NEGATIVE mg/dL
Hgb urine dipstick: NEGATIVE
Ketones, ur: NEGATIVE mg/dL
Leukocytes,Ua: NEGATIVE
Nitrite: NEGATIVE
Protein, ur: NEGATIVE mg/dL
Specific Gravity, Urine: 1.012 (ref 1.005–1.030)
pH: 5 (ref 5.0–8.0)

## 2019-06-03 LAB — CBC
HCT: 37 % — ABNORMAL LOW (ref 39.0–52.0)
Hemoglobin: 12.2 g/dL — ABNORMAL LOW (ref 13.0–17.0)
MCH: 28.4 pg (ref 26.0–34.0)
MCHC: 33 g/dL (ref 30.0–36.0)
MCV: 86.2 fL (ref 80.0–100.0)
Platelets: 287 10*3/uL (ref 150–400)
RBC: 4.29 MIL/uL (ref 4.22–5.81)
RDW: 15.7 % — ABNORMAL HIGH (ref 11.5–15.5)
WBC: 3.9 10*3/uL — ABNORMAL LOW (ref 4.0–10.5)
nRBC: 0 % (ref 0.0–0.2)

## 2019-06-03 LAB — COMPREHENSIVE METABOLIC PANEL
ALT: 40 U/L (ref 0–44)
AST: 39 U/L (ref 15–41)
Albumin: 3.6 g/dL (ref 3.5–5.0)
Alkaline Phosphatase: 53 U/L (ref 38–126)
Anion gap: 8 (ref 5–15)
BUN: 11 mg/dL (ref 8–23)
CO2: 24 mmol/L (ref 22–32)
Calcium: 8.9 mg/dL (ref 8.9–10.3)
Chloride: 105 mmol/L (ref 98–111)
Creatinine, Ser: 0.88 mg/dL (ref 0.61–1.24)
GFR calc Af Amer: >60 mL/min (ref >60)
GFR calc non Af Amer: >60 mL/min (ref >60)
Glucose, Bld: 94 mg/dL (ref 70–99)
Potassium: 3.7 mmol/L (ref 3.5–5.1)
Sodium: 137 mmol/L (ref 135–145)
Total Bilirubin: 0.6 mg/dL (ref 0.3–1.2)
Total Protein: 7 g/dL (ref 6.5–8.1)

## 2019-06-03 LAB — LIPASE, BLOOD: Lipase: 31 U/L (ref 11–51)

## 2019-06-03 MED ORDER — SODIUM CHLORIDE 0.9% FLUSH: 3.0000 mL | Freq: Once | INTRAVENOUS | Status: DC

## 2019-06-03 MED ORDER — ACETAMINOPHEN 325 MG PO TABS
650.0000 mg | ORAL_TABLET | Freq: Once | ORAL | Status: AC
  Administered 2019-06-03: 650 mg via ORAL
  Filled 2019-06-03: qty 2

## 2019-06-03 NOTE — ED Provider Notes (Addendum)
[facility]-EMERGENCY DEPT Provider Note   CSN: [id number] Arrival date & time: 06/03/19  2878     History   Chief Complaint Chief Complaint  Patient presents with  . Homeless    HPI Russell Johnston is a 68 y.o. male.     The history is provided by the patient.  Illness Location:  General Severity:  Mild Onset quality:  Gradual Timing:  Intermittent Progression:  Waxing and waning Chronicity:  New Context:  Patient here due to social issues.  He continues to try to reach at the homeless shelters with little success.  He is awaiting contact with homeless shelter.  Patient was seen in the ED several hours ago.  Does not have any specific complaints.  Asking for food. Relieved by:  Nothing Worsened by:  Nothing Associated symptoms: no abdominal pain, no chest pain, no cough, no ear pain, no fever, no rash, no shortness of breath, no sore throat and no vomiting     No past medical history on file.  There are no active problems to display for this patient.   No past surgical history on file.      Home Medications    Prior to Admission medications   Medication Sig Start Date End Date Taking? Authorizing Provider  acetaminophen (TYLENOL) 325 MG tablet Take 650 mg by mouth every 6 (six) hours as needed for mild pain or headache.    [provider]  fluPHENAZine (PROLIXIN) 5 MG tablet Take 1 tablet (5 mg total) by mouth 3 (three) times daily for 7 days. 06/02/19 06/09/19  Robinson, [place] N, PA-C    Family History No family history on file.  Social History Social History   Tobacco Use  . Smoking status: Current Some Day Smoker    Types: Cigarettes  . Smokeless tobacco: Never Used  Substance Use Topics  . Alcohol use: Yes  . Drug use: Never     Allergies   Patient has no known allergies.   Review of Systems Review of Systems  Constitutional: Negative for chills and fever.  HENT: Negative for ear pain and sore throat.   Eyes:  Negative for pain and visual disturbance.  Respiratory: Negative for cough and shortness of breath.   Cardiovascular: Negative for chest pain and palpitations.  Gastrointestinal: Negative for abdominal pain and vomiting.  Genitourinary: Negative for dysuria and hematuria.  Musculoskeletal: Positive for arthralgias. Negative for back pain.  Skin: Negative for color change and rash.  Neurological: Negative for seizures and syncope.  All other systems reviewed and are negative.    Physical Exam Updated Vital Signs BP (!) 130/100   Pulse 91   Temp 98.3 F (36.8 C)   Resp 16   SpO2 96%   Physical Exam Vitals signs and nursing note reviewed.  Constitutional:      Appearance: He is well-developed.  HENT:     Head: Normocephalic and atraumatic.  Eyes:     Extraocular Movements: Extraocular movements intact.     Conjunctiva/sclera: Conjunctivae normal.     Pupils: Pupils are equal, round, and reactive to light.  Neck:     Musculoskeletal: Neck supple.  Cardiovascular:     Rate and Rhythm: Normal rate and regular rhythm.     Pulses: Normal pulses.     Heart sounds: No murmur.  Pulmonary:     Effort: Pulmonary effort is normal. No respiratory distress.     Breath sounds: Normal breath sounds.  Abdominal:     Palpations:  Abdomen is soft.     Tenderness: There is no abdominal tenderness.  Skin:    General: Skin is warm and dry.  Neurological:     Mental Status: He is alert.      ED Treatments / Results  Labs (all labs ordered are listed, but only abnormal results are displayed) Labs Reviewed - No data to display  EKG None  Radiology No results found.  Procedures Procedures (including critical care time)  Medications Ordered in ED Medications  acetaminophen (TYLENOL) tablet 650 mg (has no administration in time range)     Initial Impression / Assessment and Plan / ED Course  I have reviewed the triage vital signs and the nursing notes.  Pertinent labs &  imaging results that were available during my care of the patient were reviewed by me and considered in my medical decision making (see chart for details).     Russell Johnston is a 68 year old male who presents to the ED due to social issues.  Patient with unremarkable vitals.  No fever.  Patient seen here several hours ago for diarrhea.  Had lab work that was unremarkable.  No signs of urine infection.  Normal electrolytes.  Normal hemoglobin.  Overall there is no acute emergencies.  Patient is here for social issues.  We discussed these.  Patient with multiple ED visits over the last several weeks.  Has had multiple discussions with social work about homeless shelters.  He has numbers in paperwork with him.  He states that he is awaiting a phone call from somebody at 1 of the local homeless shelters.  Was given Tylenol for his arthralgias that are chronic.  Will give him some food and discharge him.  This chart was dictated using voice recognition software.  Despite best efforts to proofread,  errors can occur which can change the documentation meaning.    Final Clinical Impressions(s) / ED Diagnoses   Final diagnoses:  Homelessness    ED Discharge Orders    None       Lennice Sites, DO 06/03/19 [place], [place], DO 06/03/19 [phone]

## 2019-06-03 NOTE — ED Triage Notes (Signed)
Patient here from home with complaints of abd pain and diarrhea. Reports that he was seen here earlier today.

## 2019-06-03 NOTE — ED Notes (Signed)
Pt given sandwhich and coffee

## 2019-06-03 NOTE — ED Provider Notes (Signed)
[facility] DEPT Provider Note   CSN: [id number] Arrival date & time: 06/02/19  2003     History   Chief Complaint Chief Complaint  Patient presents with  . Abdominal Pain  . Diarrhea    HPI Russell Johnston is a 68 y.o. male.     Patient presents complaining of diarrhea.  He has been seen multiple times in the recent few days and is a regular visitor to the ER here.  He has a history of homelessness.  Patient tells me that he needs a referral to a dentist and an optometrist.  He says that he is always hungry and eats all day.  This is causing him to have discomfort in his abdomen.  He has not had vomiting.  No fever.  No rectal bleeding or melena.     History reviewed. No pertinent past medical history.  There are no active problems to display for this patient.   History reviewed. No pertinent surgical history.      Home Medications    Prior to Admission medications   Medication Sig Start Date End Date Taking? Authorizing Provider  acetaminophen (TYLENOL) 325 MG tablet Take 650 mg by mouth every 6 (six) hours as needed for mild pain or headache.   Yes [provider]  fluPHENAZine (PROLIXIN) 5 MG tablet Take 1 tablet (5 mg total) by mouth 3 (three) times daily for 7 days. 06/02/19 06/09/19  Robinson, [place] N, PA-C    Family History No family history on file.  Social History Social History   Tobacco Use  . Smoking status: Current Some Day Smoker    Types: Cigarettes  . Smokeless tobacco: Never Used  Substance Use Topics  . Alcohol use: Yes  . Drug use: Never     Allergies   Patient has no known allergies.   Review of Systems Review of Systems  Gastrointestinal: Positive for abdominal pain and diarrhea.  All other systems reviewed and are negative.    Physical Exam Updated Vital Signs BP 132/89 (BP Location: Left Arm)   Pulse 91   Temp 97.9 F (36.6 C) (Oral)   Resp 18   Ht 6\' 4"  (1.93 m)   Wt 111.6 kg    SpO2 98%   BMI 29.94 kg/m   Physical Exam Vitals signs and nursing note reviewed.  Constitutional:      General: He is not in acute distress.    Appearance: Normal appearance. He is well-developed.  HENT:     Head: Normocephalic and atraumatic.     Right Ear: Hearing normal.     Left Ear: Hearing normal.     Nose: Nose normal.  Eyes:     Conjunctiva/sclera: Conjunctivae normal.     Pupils: Pupils are equal, round, and reactive to light.  Neck:     Musculoskeletal: Normal range of motion and neck supple.  Cardiovascular:     Rate and Rhythm: Regular rhythm.     Heart sounds: S1 normal and S2 normal. No murmur. No friction rub. No gallop.   Pulmonary:     Effort: Pulmonary effort is normal. No respiratory distress.     Breath sounds: Normal breath sounds.  Chest:     Chest wall: No tenderness.  Abdominal:     General: Bowel sounds are normal.     Palpations: Abdomen is soft.     Tenderness: There is no abdominal tenderness. There is no guarding or rebound. Negative signs include Murphy's sign and McBurney's sign.  Hernia: No hernia is present.  Musculoskeletal: Normal range of motion.  Skin:    General: Skin is warm and dry.     Findings: No rash.  Neurological:     Mental Status: He is alert and oriented to person, place, and time.     GCS: GCS eye subscore is 4. GCS verbal subscore is 5. GCS motor subscore is 6.     Cranial Nerves: No cranial nerve deficit.     Sensory: No sensory deficit.     Coordination: Coordination normal.  Psychiatric:        Speech: Speech normal.        Behavior: Behavior normal.        Thought Content: Thought content normal.      ED Treatments / Results  Labs (all labs ordered are listed, but only abnormal results are displayed) Labs Reviewed  CBC - Abnormal; Notable for the following components:      Result Value   WBC 3.9 (*)    Hemoglobin 12.2 (*)    HCT 37.0 (*)    RDW 15.7 (*)    All other components within normal limits   LIPASE, BLOOD  COMPREHENSIVE METABOLIC PANEL  URINALYSIS, ROUTINE W REFLEX MICROSCOPIC    EKG None  Radiology No results found.  Procedures Procedures (including critical care time)  Medications Ordered in ED Medications  sodium chloride flush (NS) 0.9 % injection 3 mL (has no administration in time range)     Initial Impression / Assessment and Plan / ED Course  I have reviewed the triage vital signs and the nursing notes.  Pertinent labs & imaging results that were available during my care of the patient were reviewed by me and considered in my medical decision making (see chart for details).        Patient presents to the emergency department stating that he has had diarrhea and abdominal pain.  He has a history of frequent visits to the emergency department secondary to homelessness.  His abdominal exam is benign and nontender.  No concern for acute surgical process.  He has good bowel sounds, no concern for obstruction.  Lab work is reassuring including a normal white blood cell count of 3.9.  He is asking for a meal.  He does not require any further work-up.  Final Clinical Impressions(s) / ED Diagnoses   Final diagnoses:  Diarrhea, unspecified type    ED Discharge Orders    None       Khanh Tanori, Canary Brim, MD 06/03/19 [phone]

## 2019-06-03 NOTE — ED Notes (Signed)
Pt on phone with homeless shelter

## 2019-06-03 NOTE — ED Notes (Signed)
Pt given breakfast tray

## 2019-06-03 NOTE — ED Triage Notes (Signed)
Pt c/o of body pain.  Pt seen earlier today.

## 2021-07-13 IMAGING — CR DG CHEST 2V
2 series · 2 of 2 positions shown · non-contrast
Comparison: None.

CLINICAL DATA: Chest pain and shortness of breath.  Nausea.

EXAM:
CHEST - 2 VIEW

[chest pa]
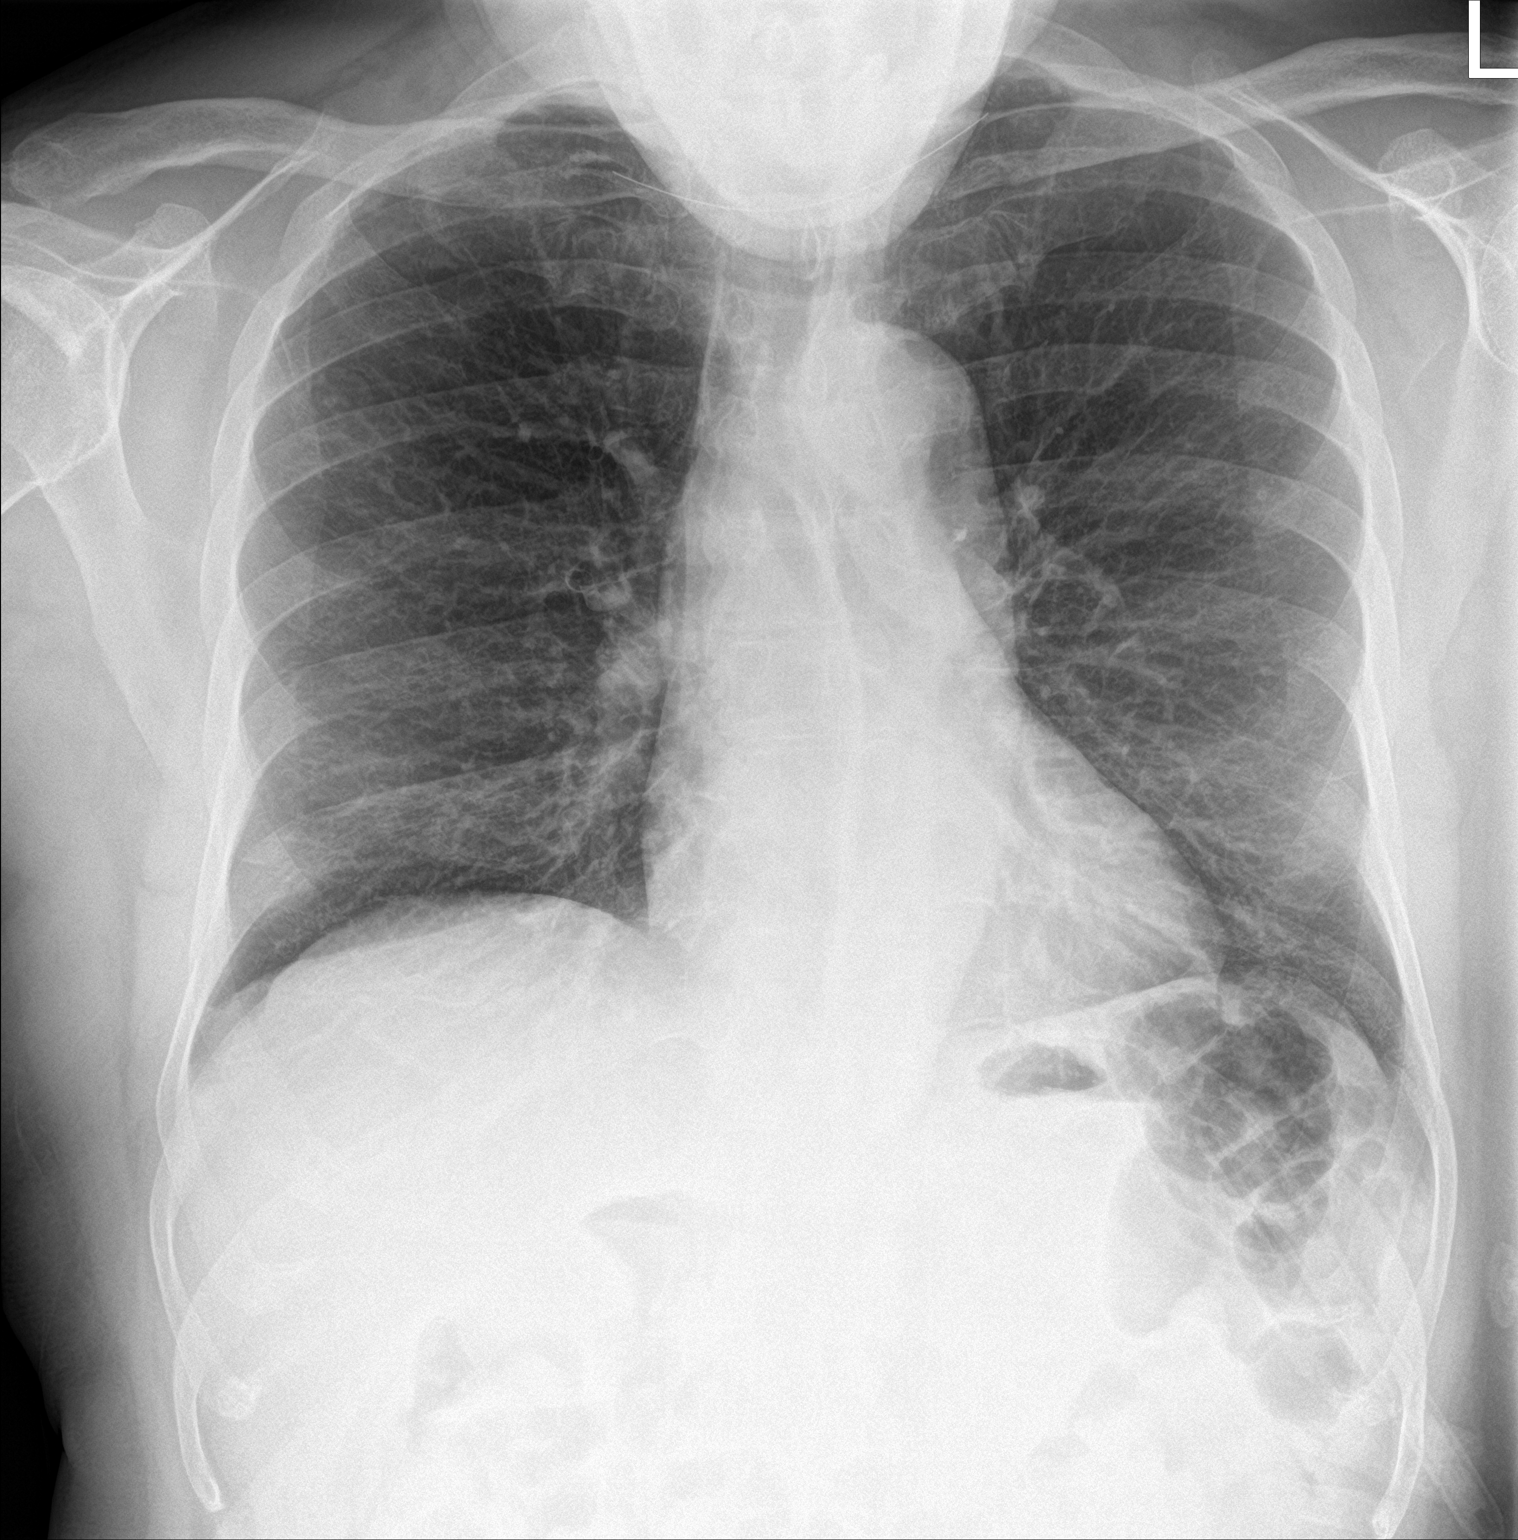

[chest lat]
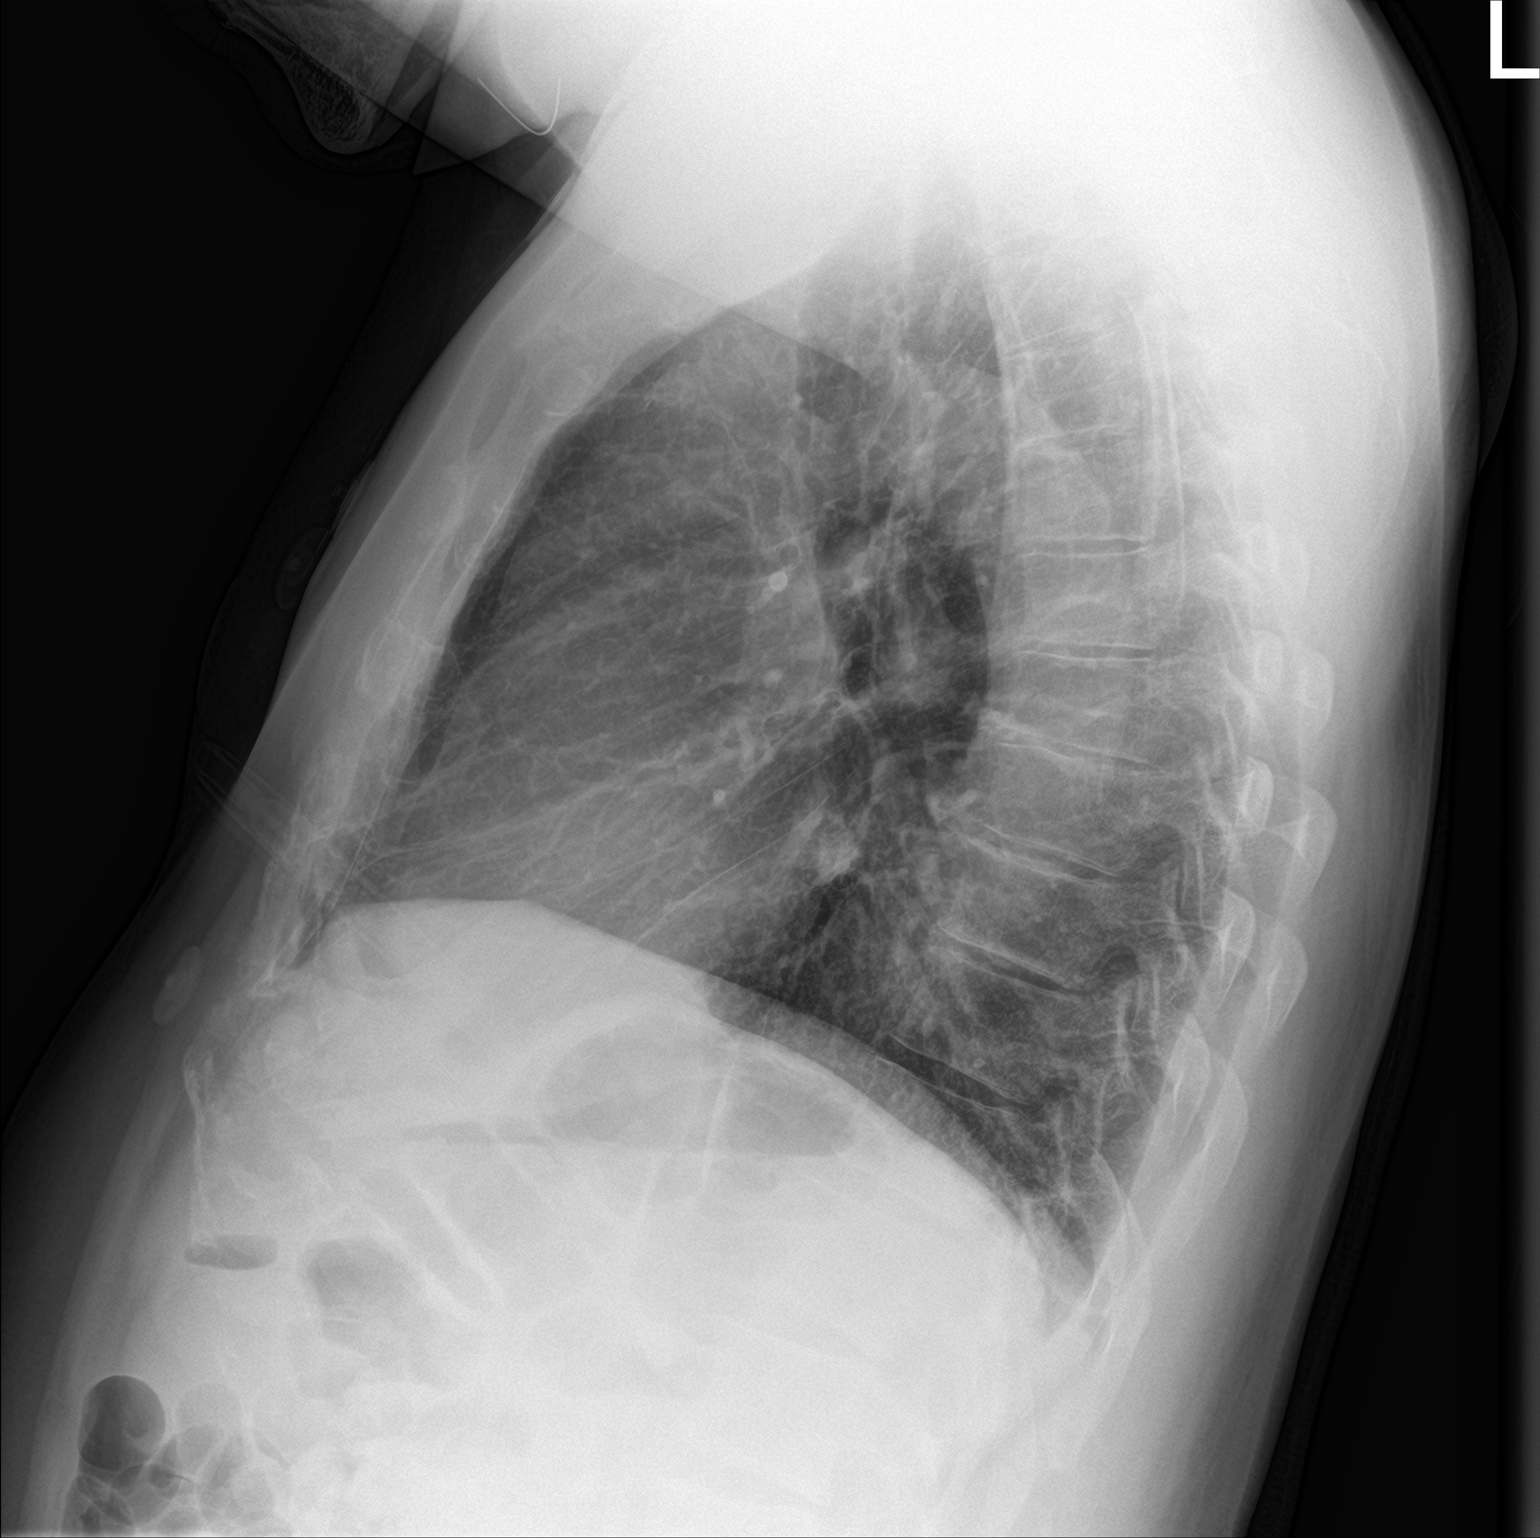

[2 of 2 positions shown; findings below may reference images not displayed]

FINDINGS: The heart size and mediastinal contours are within normal limits.
Both lungs are clear. The visualized skeletal structures are
unremarkable.
IMPRESSION: No active cardiopulmonary disease.

## 2021-07-17 IMAGING — DX DG CHEST 2V
2 series · 2 of 2 positions shown · non-contrast
Comparison: 05/24/2019

CLINICAL DATA: Shortness of breath

EXAM:
CHEST - 2 VIEW

[chest pa]
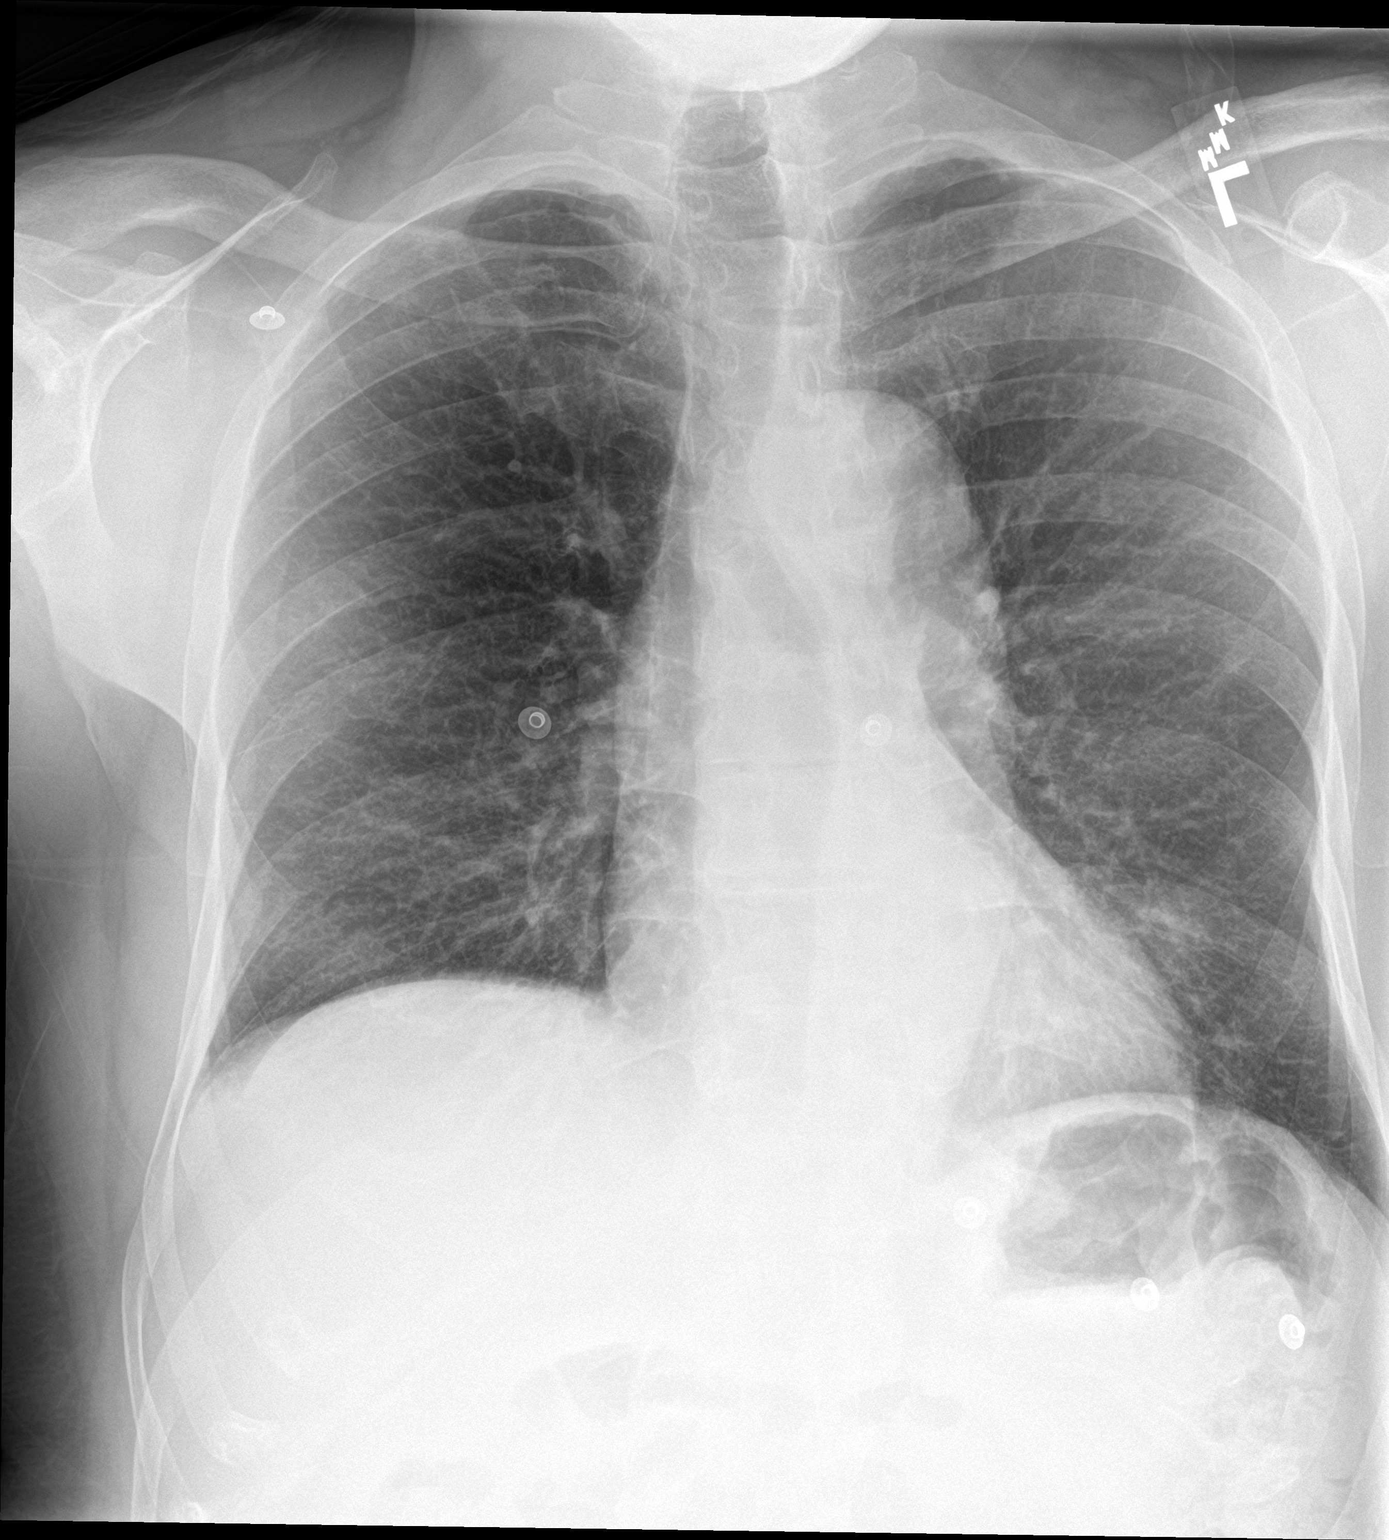

[chest lat]
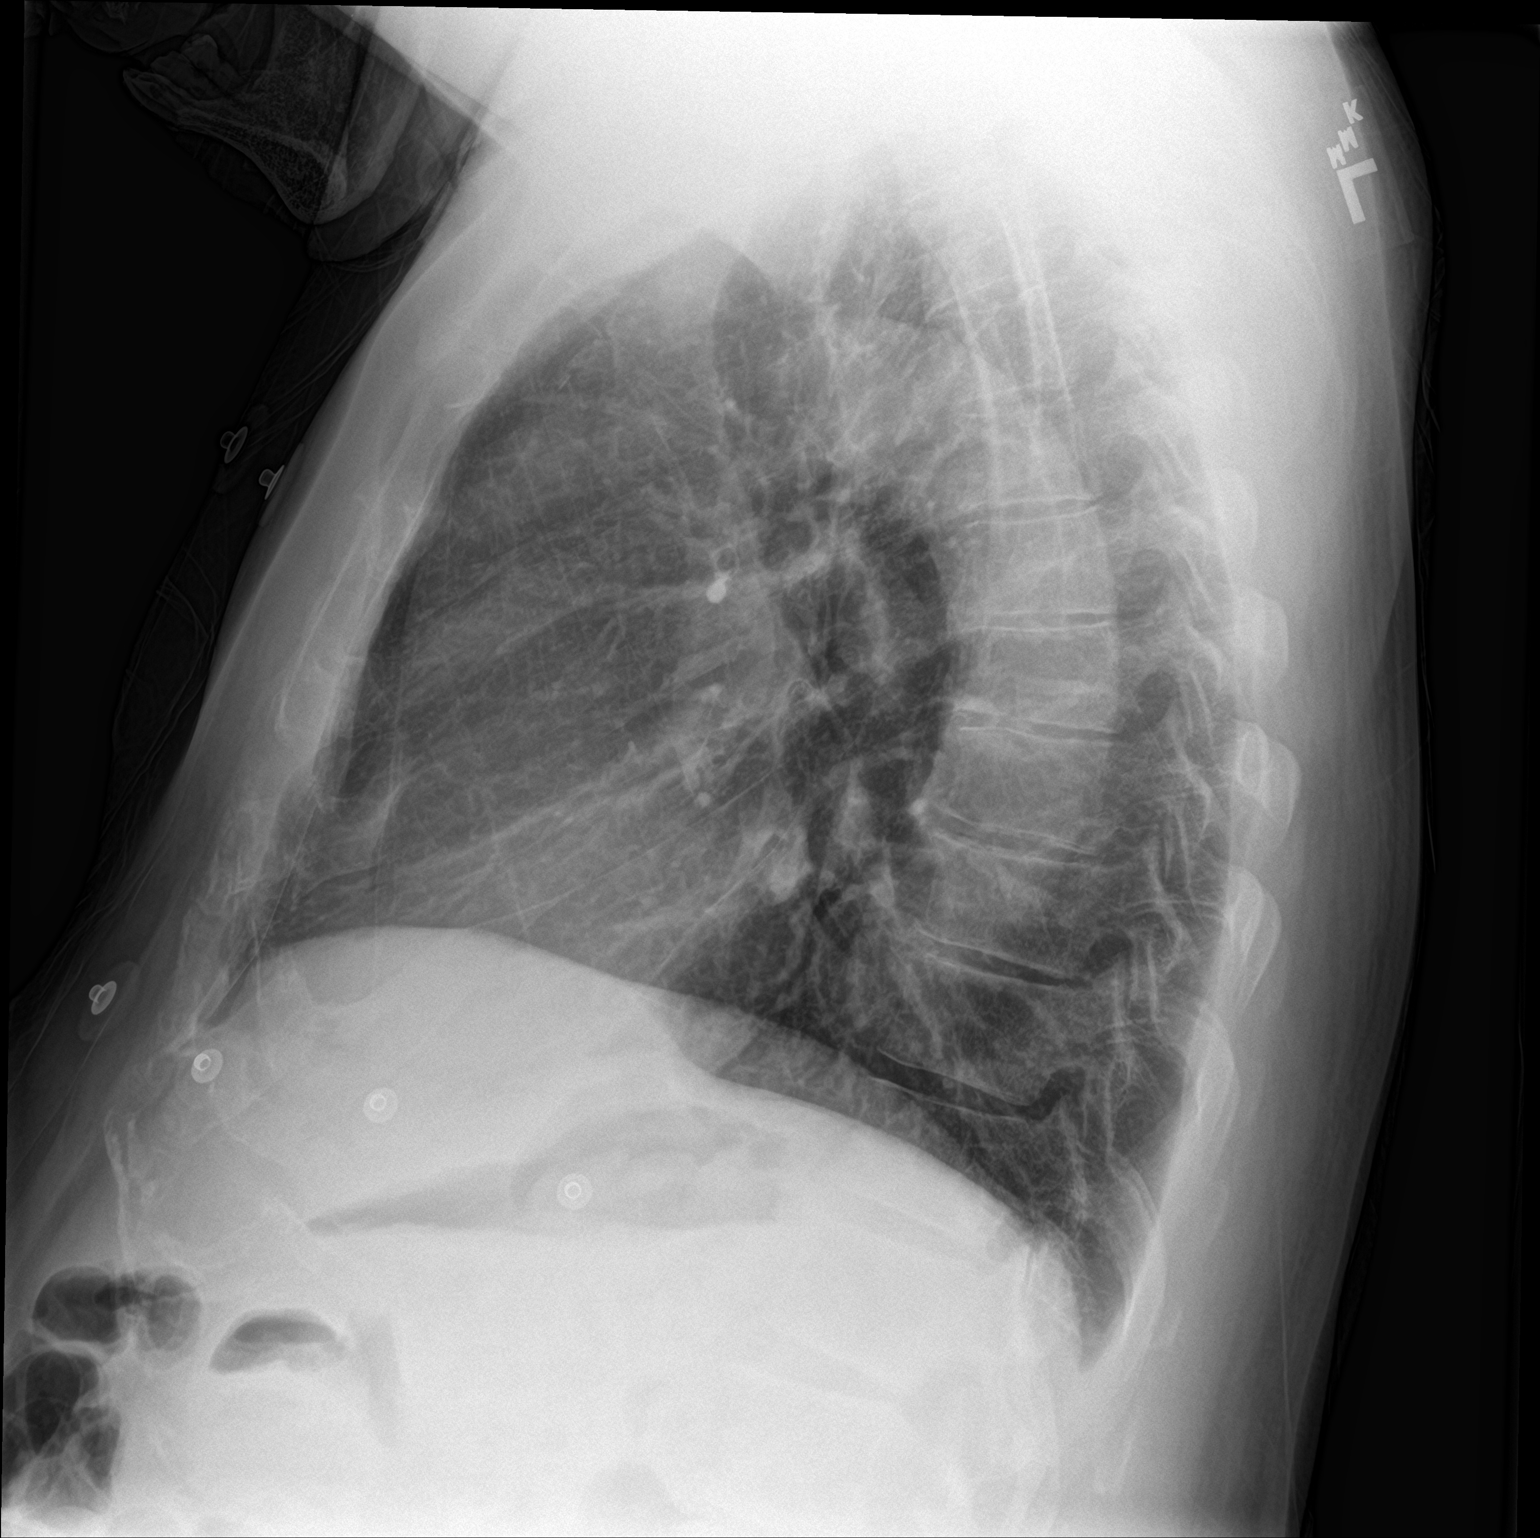

[2 of 2 positions shown; findings below may reference images not displayed]

FINDINGS: The heart size and mediastinal contours are within normal limits.
Both lungs are clear. The visualized skeletal structures are
unremarkable.
IMPRESSION: No active cardiopulmonary disease.
